# Patient Record
Sex: Female | Born: 1975 | Race: White | Hispanic: No | Marital: Married | Smoking: Former smoker
Health system: Southern US, Community
[De-identification: ages and names within clinical notes are randomized; demographics above are authoritative.]

## PROBLEM LIST (undated history)

## (undated) ENCOUNTER — Inpatient Hospital Stay: Payer: Self-pay

## (undated) DIAGNOSIS — S0300XA Dislocation of jaw, unspecified side, initial encounter: Secondary | ICD-10-CM

## (undated) DIAGNOSIS — I38 Endocarditis, valve unspecified: Secondary | ICD-10-CM

## (undated) HISTORY — DX: Endocarditis, valve unspecified: I38

## (undated) HISTORY — PX: TONSILLECTOMY: SUR1361

## (undated) HISTORY — DX: Dislocation of jaw, unspecified side, initial encounter: S03.00XA

---

## 2017-06-28 NOTE — L&D Delivery Note (Signed)
Delivery Note At 1:24 AM a viable female was delivered via Vaginal, Spontaneous (Presentation: ROA).  APGAR:8, 9; weight 8 lb 4.3 oz (3750 g).   Placenta status:spontanoeus, intact.  Cord:  3VC without complications.  Anesthesia:  Epidural Episiotomy: None Lacerations:  none Suture Repair: none Est. Blood Loss (mL):  300mL  Mom to postpartum.  Baby to Couplet care / Skin to Skin.  Karina Franco 11/30/2017, 1:41 AM

## 2017-06-30 ENCOUNTER — Ambulatory Visit (INDEPENDENT_AMBULATORY_CARE_PROVIDER_SITE_OTHER): Payer: Medicaid Other | Admitting: Advanced Practice Midwife

## 2017-06-30 ENCOUNTER — Encounter: Payer: Self-pay | Admitting: Advanced Practice Midwife

## 2017-06-30 VITALS — BP 120/66 | HR 95 | Ht 66.0 in | Wt 178.0 lb

## 2017-06-30 DIAGNOSIS — O0990 Supervision of high risk pregnancy, unspecified, unspecified trimester: Secondary | ICD-10-CM | POA: Insufficient documentation

## 2017-06-30 DIAGNOSIS — O09529 Supervision of elderly multigravida, unspecified trimester: Secondary | ICD-10-CM | POA: Insufficient documentation

## 2017-06-30 DIAGNOSIS — O99330 Smoking (tobacco) complicating pregnancy, unspecified trimester: Secondary | ICD-10-CM

## 2017-06-30 DIAGNOSIS — Z124 Encounter for screening for malignant neoplasm of cervix: Secondary | ICD-10-CM

## 2017-06-30 DIAGNOSIS — Z113 Encounter for screening for infections with a predominantly sexual mode of transmission: Secondary | ICD-10-CM

## 2017-06-30 NOTE — Patient Instructions (Signed)

## 2017-06-30 NOTE — Progress Notes (Signed)
New Obstetric Patient H&P    Chief Complaint: "Desires prenatal care"   History of Present Illness: Patient is a 42 y.o. [name] Not Hispanic or Latino female, LMP 02/28/2017 presents with amenorrhea and positive home pregnancy test. Based on her  LMP, her EDD is Estimated Date of Delivery: 12/05/2017. and her EGA is [redacted]w[redacted]d. Cycles are 5. days, regular, and occur approximately every : 28 days. Her last pap smear was 2 or 3 years ago and was no abnormalities.    She had a urine pregnancy test which was positive 2 week(s)  ago. Her last menstrual period was normal and lasted for  6 day(s). Since her LMP she claims she has experienced breast tenderness, fatigue, nausea. She denies vaginal bleeding. Her past medical history is noncontributory. Her prior pregnancies are notable for none  Since her LMP, she admits to the use of tobacco products  Yes she smokes 1 PPD She claims she has gained   13 pounds since the start of her pregnancy.  There are cats in the home in the home  no  She admits close contact with children on a regular basis  yes  She has had chicken pox in the past yes She has had Tuberculosis exposures, symptoms, or previously tested positive for TB   no Current or past history of domestic violence. no  Genetic Screening/Teratology Counseling: (Includes patient, baby's father, or anyone in either family with:)   1. Patient's age >/= 12 at [facility]  yes 2. Thalassemia ([place], [place], Mediterranean, or Asian background): MCV<80  no 3. Neural tube defect (meningomyelocele, spina bifida, anencephaly)  no 4. Congenital heart defect  no  5. Down syndrome  no 6. Tay-Sachs (Jewish, [place]  no 7. Canavan's Disease  no 8. Sickle cell disease or trait (African)  no  9. Hemophilia or other blood disorders  no  10. Muscular dystrophy  no  11. Cystic fibrosis  no  12. Huntington's Chorea  no  13. Mental retardation/autism  no 14. Other inherited genetic or chromosomal disorder   no 15. Maternal metabolic disorder (DM, PKU, etc)  no 16. Patient or FOB with a child with a birth defect not listed above no  16a. Patient or FOB with a birth defect themselves no 17. Recurrent pregnancy loss, or stillbirth  no  18. Any medications since LMP other than prenatal vitamins (include vitamins, supplements, OTC meds, drugs, alcohol)  no 19. Any other genetic/environmental exposure to discuss  no  Infection History:   1. Lives with someone with TB or TB exposed  no  2. Patient or partner has history of genital herpes  no 3. Rash or viral illness since LMP  no 4. History of STI (GC, CT, HPV, syphilis, HIV)  no 5. History of recent travel :  no  Other pertinent information:  Patient needs to have dental work done for several abscessed teeth. Dental note given to patient today.    Review of Systems:10 point review of systems negative unless otherwise noted in HPI  Past Medical History:  Past Medical History:  Diagnosis Date  . 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 TMJ (dislocation of temporomandibular joint)     Past Surgical History:  Past Surgical History:  Procedure Laterality Date  . TONSILLECTOMY      Gynecologic History: Patient's last menstrual period was 02/28/2017 (exact date).  Obstetric History: [id number]  Family History:  History reviewed. No pertinent family history.  Social History:  Social History  Socioeconomic History  . Marital status: Married    Spouse name: Not on file  . Number of children: Not on file  . Years of education: Not on file  . Highest education level: Not on file  Social Needs  . Financial resource strain: Not on file  . Food insecurity - worry: Not on file  . Food insecurity - inability: Not on file  . Transportation needs - medical: Not on file  . Transportation needs - non-medical: Not on file  Occupational History  . Not on file  Tobacco Use  . Smoking status: Current Every Day Smoker  . Smokeless tobacco:  Never Used  Substance and Sexual Activity  . Alcohol use: No    Frequency: Never  . Drug use: No  . Sexual activity: Yes    Birth control/protection: None  Other Topics Concern  . Not on file  Social History Narrative  . Not on file    Allergies:  Not on File  Medications: Prior to Admission medications   Not on File    Physical Exam Vitals: Blood pressure 120/66, pulse 95, height 5\' 6"  (1.676 m), weight 178 lb (80.7 kg), last menstrual period 02/28/2017.  General: NAD HEENT: normocephalic, anicteric, missing teeth and tooth decay noted Thyroid: no enlargement, no palpable nodules Pulmonary: No increased work of breathing, CTAB Cardiovascular: RRR, distal pulses 2+ Abdomen: NABS, soft, non-tender, non-distended.  Umbilicus without lesions.  No hepatomegaly, splenomegaly or masses palpable. No evidence of hernia  Genitourinary:  External: Normal external female genitalia.  Normal urethral meatus, normal  Bartholin's and Skene's glands.    Vagina: Normal vaginal mucosa, no evidence of prolapse.    Cervix: Grossly normal in appearance, no bleeding, no CMT  Uterus: Enlarged, mobile, normal contour.    Adnexa: ovaries non-enlarged, no adnexal masses  Rectal: deferred Extremities: no edema, erythema, or tenderness Neurologic: Grossly intact Psychiatric: mood appropriate, affect full   Assessment: 42 y.o. G5P4004 at 4375w3d by LMP presenting to initiate prenatal care  Plan: 1) Avoid alcoholic beverages. 2) Patient encouraged not to smoke.  3) Discontinue the use of all non-medicinal drugs and chemicals.  4) Take prenatal vitamins daily.  5) Nutrition, food safety (fish, cheese advisories, and high nitrite foods) and exercise discussed. 6) Hospital and practice style discussed with cross coverage system.  7) Genetic Screening, such as with 1st Trimester Screening, cell free fetal DNA, AFP testing, and Ultrasound, as well as with amniocentesis and CVS as appropriate, is  discussed with patient. At the conclusion of today's visit patient declines genetic testing. She is aware she is beyond the testing time for 1st trimester screen and declines 2nd trimester screen. 8) Patient is asked about travel to areas at risk for the Zika virus, and counseled to avoid travel and exposure to mosquitoes or sexual partners who may have themselves been exposed to the virus. Testing is discussed, and will be ordered as appropriate.   Tresea MallJane Ashlee Bewley, CNM

## 2017-06-30 NOTE — Progress Notes (Signed)
[facility] Dizz[facility]

## 2017-07-01 LAB — RPR+RH+ABO+RUB AB+AB SCR+CB...
Antibody Screen: NEGATIVE
HEP B S AG: NEGATIVE
HIV SCREEN 4TH GENERATION: NONREACTIVE
Hematocrit: 33.7 % — ABNORMAL LOW (ref 34.0–46.6)
Hemoglobin: 11.5 g/dL (ref 11.1–15.9)
MCH: 30.3 pg (ref 26.6–33.0)
MCHC: 34.1 g/dL (ref 31.5–35.7)
MCV: 89 fL (ref 79–97)
PLATELETS: 208 10*3/uL (ref 150–379)
RBC: 3.79 x10E6/uL (ref 3.77–5.28)
RDW: 15 % (ref 12.3–15.4)
RPR: NONREACTIVE
RUBELLA: 2.24 {index} (ref >0.99)
Rh Factor: POSITIVE
VARICELLA: 2097 {index} (ref >165)
WBC: 9.4 10*3/uL (ref 3.4–10.8)

## 2017-07-02 LAB — URINE CULTURE

## 2017-07-02 LAB — IGP,CTNGTV,RFX APTIMA HPV ASCU
Chlamydia, Nuc. Acid Amp: NEGATIVE
GONOCOCCUS, NUC. ACID AMP: NEGATIVE
PAP Smear Comment: 0
Trich vag by NAA: NEGATIVE

## 2017-07-11 ENCOUNTER — Ambulatory Visit (INDEPENDENT_AMBULATORY_CARE_PROVIDER_SITE_OTHER): Payer: Medicaid Other

## 2017-07-11 ENCOUNTER — Ambulatory Visit (INDEPENDENT_AMBULATORY_CARE_PROVIDER_SITE_OTHER): Payer: Medicaid Other | Admitting: Maternal Newborn

## 2017-07-11 VITALS — BP 122/78 | Wt 183.0 lb

## 2017-07-11 DIAGNOSIS — Z3A19 19 weeks gestation of pregnancy: Secondary | ICD-10-CM

## 2017-07-11 DIAGNOSIS — O0990 Supervision of high risk pregnancy, unspecified, unspecified trimester: Secondary | ICD-10-CM

## 2017-07-11 DIAGNOSIS — Z362 Encounter for other antenatal screening follow-up: Secondary | ICD-10-CM | POA: Diagnosis not present

## 2017-07-11 DIAGNOSIS — Z3689 Encounter for other specified antenatal screening: Secondary | ICD-10-CM

## 2017-07-11 NOTE — Progress Notes (Signed)
    Routine Prenatal Care Visit  Subjective  Dewain PenningRachelle Wittwer is a 42 y.o. G5P4004 at 4669w0d being seen today for ongoing prenatal care.  She is currently monitored for the following issues for this high-risk pregnancy and has Tobacco smoking affecting pregnancy, antepartum; Antepartum multigravida of advanced maternal age; and Supervision of high risk pregnancy, antepartum on their problem list.  ----------------------------------------------------------------------------------- Patient reports no complaints.   Vag. Bleeding: None.  Movement: Present. Denies leaking of fluid.  ----------------------------------------------------------------------------------- The following portions of the patient's history were reviewed and updated as appropriate: allergies, current medications, past family history, past medical history, past social history, past surgical history and problem list. Problem list updated.   Objective  Blood pressure 122/78, weight 183 lb (83 kg), last menstrual period 02/28/2017. Pregravid weight 165 lb (74.8 kg) Total Weight Gain 18 lb (8.165 kg) Urinalysis: Urine Protein: Negative Urine Glucose: Negative  Fetal Status: Fetal Heart Rate (bpm): 153   Movement: Present     General:  Alert, oriented and cooperative. Patient is in no acute distress.  Skin: Skin is warm and dry. No rash noted.   Cardiovascular: Normal heart rate noted  Respiratory: Normal respiratory effort, no problems with respiration noted  Abdomen: Soft, gravid, appropriate for gestational age. Pain/Pressure: Absent     Pelvic:  Cervical exam deferred        Extremities: Normal range of motion.     Mental Status: Normal mood and affect. Normal behavior. Normal judgment and thought content.     Assessment   42 y.o. N8G9562G5P4004 at 6069w0d, EDD 12/05/2017, by Last Menstrual Period presenting for routine prenatal visit.  Plan   pregnancy#5 Problems (from 02/28/17 to present)    Problem Noted Resolved   Supervision of high risk pregnancy, antepartum 06/30/2017 by Tresea MallGledhill, Jane, CNM No   Overview Addendum 07/11/2017  4:25 PM by Oswaldo ConroySchmid, Melquisedec Journey Y, CNM    [facility]inic [facility] Prenatal Labs  Dating  Blood type: A/Positive/-- (01/03 1140)   Genetic Screen 1 Screen:    AFP:     Quad:     NIPS: Antibody:Negative (01/03 1140)  Anatomic US  Rubella: 2.24 (01/03 1140) Varicella: @VZVIGG @  GTT Early:               Third trimester:  RPR: Non Reactive (01/03 1140)   Rhogam  HBsAg: Negative (01/03 1140)   TDaP vaccine    Flu Shot: declines HIV: Non Reactive (01/03 1140)   Baby Food                                GBS:   Contraception Plans BTL Pap:  CBB     CS/VBAC NA   Support Person John           Discussed need to follow up with an ultrasound next visit due to incomplete anatomy scan.   Preterm labor symptoms and general obstetric precautions including but not limited to vaginal bleeding, contractions, leaking of fluid and fetal movement were reviewed in detail with the patient.  Return in about 4 weeks (around 08/08/2017) for ROB and f/u anatomy ultrasound.  Marcelyn BruinsJacelyn Tywanda Rice, CNM 07/11/2017  4:39 PM

## 2017-07-11 NOTE — Progress Notes (Signed)
U/s today. No vb no lof.  

## 2017-07-14 DIAGNOSIS — Z0120 Encounter for dental examination and cleaning without abnormal findings: Secondary | ICD-10-CM | POA: Insufficient documentation

## 2017-08-08 ENCOUNTER — Encounter: Payer: Medicaid Other | Admitting: Maternal Newborn

## 2017-08-08 ENCOUNTER — Other Ambulatory Visit: Payer: Medicaid Other

## 2017-08-15 ENCOUNTER — Ambulatory Visit (INDEPENDENT_AMBULATORY_CARE_PROVIDER_SITE_OTHER): Payer: Medicaid Other

## 2017-08-15 ENCOUNTER — Encounter: Payer: Self-pay | Admitting: Advanced Practice Midwife

## 2017-08-15 ENCOUNTER — Ambulatory Visit (INDEPENDENT_AMBULATORY_CARE_PROVIDER_SITE_OTHER): Payer: Medicaid Other | Admitting: Advanced Practice Midwife

## 2017-08-15 VITALS — BP 100/62 | Wt 194.0 lb

## 2017-08-15 DIAGNOSIS — Z3689 Encounter for other specified antenatal screening: Secondary | ICD-10-CM

## 2017-08-15 DIAGNOSIS — Z131 Encounter for screening for diabetes mellitus: Secondary | ICD-10-CM

## 2017-08-15 DIAGNOSIS — O0990 Supervision of high risk pregnancy, unspecified, unspecified trimester: Secondary | ICD-10-CM

## 2017-08-15 DIAGNOSIS — Z130 Encounter for screening for diseases of the blood and blood-forming organs and certain disorders involving the immune mechanism: Secondary | ICD-10-CM

## 2017-08-15 DIAGNOSIS — Z3A24 24 weeks gestation of pregnancy: Secondary | ICD-10-CM

## 2017-08-15 DIAGNOSIS — Z113 Encounter for screening for infections with a predominantly sexual mode of transmission: Secondary | ICD-10-CM

## 2017-08-15 NOTE — Progress Notes (Addendum)
  Routine Prenatal Care Visit  Subjective  Karina Franco is a 42 y.o. G5P4004 at 5883w0d being seen today for ongoing prenatal care.  She is currently monitored for the following issues for this high-risk pregnancy and has Tobacco smoking affecting pregnancy, antepartum; Antepartum multigravida of advanced maternal age; and Supervision of high risk pregnancy, antepartum on their problem list.  ----------------------------------------------------------------------------------- Patient reports hip pain.   Contractions: Not present. Vag. Bleeding: None.  Movement: Present. Denies leaking of fluid.  ----------------------------------------------------------------------------------- The following portions of the patient's history were reviewed and updated as appropriate: allergies, current medications, past family history, past medical history, past social history, past surgical history and problem list. Problem list updated.   Objective  Blood pressure 100/62, weight 194 lb (88 kg), last menstrual period 02/28/2017. Pregravid weight 165 lb (74.8 kg) Total Weight Gain 29 lb (13.2 kg) Urinalysis:      Fetal Status: Fetal Heart Rate (bpm): 153   Movement: Present     Anatomy f/u today: anatomy is now complete  General:  Alert, oriented and cooperative. Patient is in no acute distress.  Skin: Skin is warm and dry. No rash noted.   Cardiovascular: Normal heart rate noted  Respiratory: Normal respiratory effort, no problems with respiration noted  Abdomen: Soft, gravid, appropriate for gestational age. Pain/Pressure: Absent     Pelvic:  Cervical exam deferred        Extremities: Normal range of motion.  Edema: None  Mental Status: Normal mood and affect. Normal behavior. Normal judgment and thought content.   Assessment   42 y.o. N8G9562G5P4004 at 10183w0d by  12/05/2017, by Last Menstrual Period presenting for routine prenatal visit  Plan   pregnancy#5 Problems (from 02/28/17 to present)    Problem  Noted Resolved   Supervision of high risk pregnancy, antepartum 06/30/2017 by Tresea MallGledhill, Kedrick Mcnamee, CNM No   Overview Addendum 07/11/2017  4:39 PM by Oswaldo ConroySchmid, Jacelyn Y, CNM    [facility] Prenatal Labs  Dating L=19 Blood type: A/Positive/-- (01/03 1140)   Genetic Screen 1 Screen:    AFP:     Quad:     NIPS: Antibody:Negative (01/03 1140)  Anatomic US Follow up for cardiac, diaphragm, nose/lips Rubella: 2.24 (01/03 1140) Varicella: Immune  GTT Early:               Third trimester:  RPR: Non Reactive (01/03 1140)   Rhogam  HBsAg: Negative (01/03 1140)   TDaP vaccine    Flu Shot: declines HIV: Non Reactive (01/03 1140)   Baby Food                                GBS:   Contraception Plans BTL Pap:  CBB     CS/VBAC NA   Support Person John           AMA:  Growth scan 32 wks [ ] , 36 wks [ ]  NST/AFI weekly at 36 wks   Preterm labor symptoms and general obstetric precautions including but not limited to vaginal bleeding, contractions, leaking of fluid and fetal movement were reviewed in detail with the patient. Please refer to After Visit Summary for other counseling recommendations.   Return in about 4 weeks (around 09/12/2017) for 3 hr gtt/other 28 wk labs/rob.  Tresea MallJane Delphine Sizemore, CNM  08/15/2017 2:55 PM

## 2017-08-15 NOTE — Progress Notes (Addendum)
ROB U/S today 

## 2017-09-12 ENCOUNTER — Other Ambulatory Visit: Payer: Medicaid Other

## 2017-09-12 ENCOUNTER — Ambulatory Visit (INDEPENDENT_AMBULATORY_CARE_PROVIDER_SITE_OTHER): Payer: Medicaid Other | Admitting: Obstetrics and Gynecology

## 2017-09-12 VITALS — BP 120/68 | Wt 190.0 lb

## 2017-09-12 DIAGNOSIS — K219 Gastro-esophageal reflux disease without esophagitis: Secondary | ICD-10-CM

## 2017-09-12 DIAGNOSIS — O09529 Supervision of elderly multigravida, unspecified trimester: Secondary | ICD-10-CM

## 2017-09-12 DIAGNOSIS — Z3A28 28 weeks gestation of pregnancy: Secondary | ICD-10-CM

## 2017-09-12 DIAGNOSIS — O0990 Supervision of high risk pregnancy, unspecified, unspecified trimester: Secondary | ICD-10-CM

## 2017-09-12 DIAGNOSIS — O99330 Smoking (tobacco) complicating pregnancy, unspecified trimester: Secondary | ICD-10-CM

## 2017-09-12 MED ORDER — SUCRALFATE 1 GM/10ML PO SUSP: 1.0000 g | Freq: Four times a day (QID) | ORAL | 1 refills | Status: DC

## 2017-09-12 NOTE — Progress Notes (Signed)
    Routine Prenatal Care Visit  Subjective  Karina Franco is a 42 y.o. G5P4004 at 4421w0d being seen today for ongoing prenatal care.  She is currently monitored for the following issues for this high-risk pregnancy and has Tobacco smoking affecting pregnancy, antepartum; Antepartum multigravida of advanced maternal age; Supervision of high risk pregnancy, antepartum; and Gastroesophageal reflux disease without esophagitis on their problem list.  ----------------------------------------------------------------------------------- Patient reports heartburn.   Contractions: Not present. Vag. Bleeding: None.  Movement: Present. Denies leaking of fluid.     Objective   Vitals:   09/12/17 1007  BP: 120/68  Weight: 190 lb (86.2 kg)   Pregravid Weight: 165 lb (74.8 kg)  Total Weight Gain: 25 lb (11.3 kg)  Urinalysis: Urine Protein: Negative Urine Glucose: 1+  Fetal Status: Fetal Heart Rate (bpm): 145   Movement: Present     General: Alert: Oriented and cooperative. Patient is in no acute distress. Skin: Skin is warm and dry. No rash noted.  Cardiovascular: Regular rate and rhythm. No murmurs, gallops, or rubs. Respiratory: Normal respiratory effort, no problems with respiration noted Abdomen: Soft, gravid, appropriate for gestational age. Pain/Pressure: Present Pelvic: Cervical exam deferred       Extremeties: Normal range of motion.    Mental Status: Normal mood and affect. Normal behavior. Normal judgment and thought content.  Assessment   42 y.o. O9G2952G5P4004 at 4221w0d by  12/05/2017, by Last Menstrual Period presenting for routine prenatal visit  Plan   pregnancy#5 Problems (from 02/28/17 to present)    Problem Noted Resolved   Supervision of high risk pregnancy, antepartum 06/30/2017 by Tresea MallGledhill, Jane, CNM No   Overview Addendum 09/12/2017 10:48 AM by Natale MilchSchuman, Calyse Murcia R, MD    [facility] Prenatal Labs  Dating L=19 Blood type: A/Positive/-- (01/03 1140)   Genetic Screen  Declined Antibody:Negative (01/03 1140)  Anatomic US Follow up for cardiac, diaphragm, nose/lips Rubella: 2.24 (01/03 1140) Varicella: Immune  GTT  Third trimester:  RPR: Non Reactive (01/03 1140)   Rhogam  not needed HBsAg: Negative (01/03 1140)   TDaP vaccine     Flu Shot: declines HIV: Non Reactive (01/03 1140)   Baby Food  Bottle                              GBS:  Hx of infant with GBS pneumonia, treat patient in labor  Contraception Plans BTL  Pap: NIL 2019  CBB   Given information   CS/VBAC NA   Support Person John              Preterm labor symptoms and general obstetric precautions including but not limited to vaginal bleeding, contractions, leaking of fluid and fetal movement were reviewed in detail with the patient.  Please refer to After Visit Summary for other counseling recommendations.   3hr GTT per preference of patient Carafate for GERD Calamine lotion for dry skin/ itching Given information for cord blood banking   Sign tubal consent at next visit.  Return in about 2 weeks (around 09/26/2017) for ROB.  Adelene Idlerhristanna Prerna Harold M.D. 09/12/17 10:52 AM

## 2017-09-12 NOTE — Progress Notes (Signed)
ROB Dizzy spells Rash on back itches 3 hour GTT w/other 28 week labs

## 2017-09-13 LAB — CBC WITH DIFFERENTIAL/PLATELET
Basophils Absolute: 0 10*3/uL (ref 0.0–0.2)
Basos: 0 %
EOS (ABSOLUTE): 0.1 10*3/uL (ref 0.0–0.4)
EOS: 1 %
HEMATOCRIT: 34.4 % (ref 34.0–46.6)
Hemoglobin: 11.2 g/dL (ref 11.1–15.9)
Immature Grans (Abs): 0.1 10*3/uL (ref 0.0–0.1)
Immature Granulocytes: 1 %
LYMPHS ABS: 1.2 10*3/uL (ref 0.7–3.1)
Lymphs: 13 %
MCH: 31.5 pg (ref 26.6–33.0)
MCHC: 32.6 g/dL (ref 31.5–35.7)
MCV: 97 fL (ref 79–97)
MONOS ABS: 0.3 10*3/uL (ref 0.1–0.9)
Monocytes: 4 %
NEUTROS ABS: 7.1 10*3/uL — AB (ref 1.4–7.0)
NEUTROS PCT: 81 %
PLATELETS: 181 10*3/uL (ref 150–379)
RBC: 3.55 x10E6/uL — ABNORMAL LOW (ref 3.77–5.28)
RDW: 13.7 % (ref 12.3–15.4)
WBC: 8.7 10*3/uL (ref 3.4–10.8)

## 2017-09-13 LAB — GESTATIONAL GLUCOSE TOLERANCE
GLUCOSE 1 HOUR GTT: 179 mg/dL (ref 65–179)
GLUCOSE 2 HOUR GTT: 157 mg/dL — AB (ref 65–154)
GLUCOSE 3 HOUR GTT: 33 mg/dL — AB (ref 65–139)
Glucose, Fasting: 88 mg/dL (ref 65–94)

## 2017-09-13 LAB — RPR QUALITATIVE: RPR Ser Ql: NONREACTIVE

## 2017-09-13 LAB — HIV ANTIBODY (ROUTINE TESTING W REFLEX): HIV SCREEN 4TH GENERATION: NONREACTIVE

## 2017-09-29 ENCOUNTER — Encounter: Payer: Self-pay | Admitting: Obstetrics and Gynecology

## 2017-09-29 ENCOUNTER — Ambulatory Visit (INDEPENDENT_AMBULATORY_CARE_PROVIDER_SITE_OTHER): Payer: Medicaid Other | Admitting: Obstetrics and Gynecology

## 2017-09-29 VITALS — BP 114/78 | Wt 199.0 lb

## 2017-09-29 DIAGNOSIS — K219 Gastro-esophageal reflux disease without esophagitis: Secondary | ICD-10-CM

## 2017-09-29 DIAGNOSIS — O09529 Supervision of elderly multigravida, unspecified trimester: Secondary | ICD-10-CM

## 2017-09-29 DIAGNOSIS — O0990 Supervision of high risk pregnancy, unspecified, unspecified trimester: Secondary | ICD-10-CM

## 2017-09-29 DIAGNOSIS — Z23 Encounter for immunization: Secondary | ICD-10-CM

## 2017-09-29 DIAGNOSIS — Z3A30 30 weeks gestation of pregnancy: Secondary | ICD-10-CM

## 2017-09-29 DIAGNOSIS — B379 Candidiasis, unspecified: Secondary | ICD-10-CM

## 2017-09-29 DIAGNOSIS — O99330 Smoking (tobacco) complicating pregnancy, unspecified trimester: Secondary | ICD-10-CM

## 2017-09-29 MED ORDER — PANTOPRAZOLE SODIUM 40 MG PO TBEC: 40.0000 mg | DELAYED_RELEASE_TABLET | Freq: Every day | ORAL | 3 refills | Status: DC

## 2017-09-29 MED ORDER — NYSTATIN 100000 UNIT/GM EX CREA: 1.0000 "application " | TOPICAL_CREAM | Freq: Two times a day (BID) | CUTANEOUS | 0 refills | Status: AC

## 2017-09-29 MED ORDER — PRENATAL 27-0.8 MG PO TABS: 1.0000 | ORAL_TABLET | Freq: Every day | ORAL | Status: DC

## 2017-09-29 NOTE — Progress Notes (Signed)
  Routine Prenatal Care Visit  Subjective  Karina Franco is a 42 y.o. G5P4004 at 10071w3d being seen today for ongoing prenatal care.  She is currently monitored for the following issues for this high-risk pregnancy and has Tobacco smoking affecting pregnancy, antepartum; Antepartum multigravida of advanced maternal age; Supervision of high risk pregnancy, antepartum; Gastroesophageal reflux disease without esophagitis; and Candida infection on their problem list.  ----------------------------------------------------------------------------------- Patient reports rash under right breast- consistent with candida Contractions: Not present. Vag. Bleeding: None.  Movement: Present. Denies leaking of fluid.  ----------------------------------------------------------------------------------- The following portions of the patient's history were reviewed and updated as appropriate: allergies, current medications, past family history, past medical history, past social history, past surgical history and problem list. Problem list updated.   Objective  Blood pressure 114/78, weight 199 lb (90.3 kg), last menstrual period 02/28/2017. Pregravid weight 165 lb (74.8 kg) Total Weight Gain 34 lb (15.4 kg) Urinalysis:      Fetal Status: Fetal Heart Rate (bpm): 145 Fundal Height: 30 cm Movement: Present     General:  Alert, oriented and cooperative. Patient is in no acute distress.  Skin: Skin is warm and dry. No rash noted.   Cardiovascular: Normal heart rate noted  Respiratory: Normal respiratory effort, no problems with respiration noted  Abdomen: Soft, gravid, appropriate for gestational age. Pain/Pressure: Absent     Pelvic:  Cervical exam deferred        Extremities: Normal range of motion.     Mental Status: Normal mood and affect. Normal behavior. Normal judgment and thought content.   Assessment   42 y.o. [id number] at 3171w3d by  12/05/2017, by Last Menstrual Period presenting for routine prenatal  visit  Plan   pregnancy#5 Problems (from 02/28/17 to present)    Problem Noted Resolved   Supervision of high risk pregnancy, antepartum 06/30/2017 by Tresea MallGledhill, Jane, CNM No   Overview Addendum 09/12/2017 10:48 AM by Natale MilchSchuman, Christanna R, MD    [facility] Prenatal Labs  Dating L=19 Blood type: A/Positive/-- (01/03 1140)   Genetic Screen Declined Antibody:Negative (01/03 1140)  Anatomic US Follow up for cardiac, diaphragm, nose/lips Rubella: 2.24 (01/03 1140) Varicella: Immune  GTT  Third trimester:  RPR: Non Reactive (01/03 1140)   Rhogam  not needed HBsAg: Negative (01/03 1140)   TDaP vaccine     Flu Shot: declines HIV: Non Reactive (01/03 1140)   Baby Food  Bottle                              GBS:  Hx of infant with GBS pneumonia, treat patient in labor  Contraception Plans BTL Pap: NIL 2019  CBB   Given information   CS/VBAC NA   Support Person John              Preterm labor symptoms and general obstetric precautions including but not limited to vaginal bleeding, contractions, leaking of fluid and fetal movement were reviewed in detail with the patient. Please refer to After Visit Summary for other counseling recommendations.   - continued heartburn. Protonix Rx given - candida infection: topical nystatin - sign BTL papers today  Return in about 2 weeks (around 10/13/2017) for Routine Prenatal Appointment.  Thomasene MohairStephen Masaichi Kracht, MD, Merlinda FrederickFACOG [facility], [facility] 09/29/2017 3:12 PM

## 2017-09-29 NOTE — Progress Notes (Incomplete)
  Routine Prenatal Care Visit  Subjective  Karina Franco is a 41 y.o. G5P4004 at [redacted]w[redacted]d being seen today for ongoing prenatal care.  She is currently monitored for the following issues for this {Blank single:19197::"high-risk","low-risk"} pregnancy and has Tobacco smoking affecting pregnancy, antepartum; Antepartum multigravida of advanced maternal age; Supervision of high risk pregnancy, antepartum; Gastroesophageal reflux disease without esophagitis; and Candida infection on their problem list.  ----------------------------------------------------------------------------------- Patient reports {sx:14538}.   Contractions: Not present. Vag. Bleeding: None.  Movement: Present. Denies leaking of fluid.  ----------------------------------------------------------------------------------- The following portions of the patient's history were reviewed and updated as appropriate: allergies, current medications, past family history, past medical history, past social history, past surgical history and problem list. Problem list updated.   Objective  Blood pressure 114/78, weight 199 lb (90.3 kg), last menstrual period 02/28/2017. Pregravid weight 165 lb (74.8 kg) Total Weight Gain 34 lb (15.4 kg) Urinalysis:      Fetal Status: Fetal Heart Rate (bpm): 145 Fundal Height: 30 cm Movement: Present     General:  Alert, oriented and cooperative. Patient is in no acute distress.  Skin: Skin is warm and dry. No rash noted.   Cardiovascular: Normal heart rate noted  Respiratory: Normal respiratory effort, no problems with respiration noted  Abdomen: Soft, gravid, appropriate for gestational age. Pain/Pressure: Absent     Pelvic:  {Blank single:19197::"Cervical exam performed","Cervical exam deferred"}        Extremities: Normal range of motion.     Mental Status: Normal mood and affect. Normal behavior. Normal judgment and thought content.   Assessment   41 y.o. G5P4004 at [redacted]w[redacted]d by  12/05/2017, by Last  Menstrual Period presenting for {Blank single:19197::"routine","work-in"} prenatal visit  Plan   pregnancy#5 Problems (from 02/28/17 to present)    Problem Noted Resolved   Supervision of high risk pregnancy, antepartum 06/30/2017 by Gledhill, Jane, CNM No   Overview Addendum 09/12/2017 10:48 AM by Schuman, Christanna R, MD    [facility] Prenatal Labs  Dating L=19 Blood type: A/Positive/-- (01/03 1140)   Genetic Screen Declined Antibody:Negative (01/03 1140)  Anatomic US Follow up for cardiac, diaphragm, nose/lips Rubella: 2.24 (01/03 1140) Varicella: Immune  GTT  Third trimester:  RPR: Non Reactive (01/03 1140)   Rhogam  not needed HBsAg: Negative (01/03 1140)   TDaP vaccine     Flu Shot: declines HIV: Non Reactive (01/03 1140)   Baby Food  Bottle                              GBS:  Hx of infant with GBS pneumonia, treat patient in labor  Contraception Plans BTL Pap: NIL 2019  CBB   Given information   CS/VBAC NA   Support Person John              {Blank single:19197::"Term","Preterm"} labor symptoms and general obstetric precautions including but not limited to vaginal bleeding, contractions, leaking of fluid and fetal movement were reviewed in detail with the patient. Please refer to After Visit Summary for other counseling recommendations.   Return in about 2 weeks (around 10/13/2017) for Routine Prenatal Appointment.  Tayshun Gappa, MD, FACOG [facility], [facility] 09/29/2017 3:12 PM   

## 2017-09-29 NOTE — Progress Notes (Incomplete)
  Routine Prenatal Care Visit  Subjective  Karina Franco is a [age] y.o. G5P4004 at 7956w3d being seen today for ongoing prenatal care.  She is currently monitored for the following issues for this {Blank single:19197::"high-risk","low-risk"} pregnancy and has Tobacco smoking affecting pregnancy, antepartum; Antepartum multigravida of advanced maternal age; Supervision of high risk pregnancy, antepartum; Gastroesophageal reflux disease without esophagitis; and Candida infection on their problem list.  ----------------------------------------------------------------------------------- Patient reports {sx:14538}.   Contractions: Not present. Vag. Bleeding: None.  Movement: Present. Denies leaking of fluid.  ----------------------------------------------------------------------------------- The following portions of the patient's history were reviewed and updated as appropriate: allergies, current medications, past family history, past medical history, past social history, past surgical history and problem list. Problem list updated.   Objective  Blood pressure 114/78, weight 199 lb (90.3 kg), last menstrual period 02/28/2017. Pregravid weight 165 lb (74.8 kg) Total Weight Gain 34 lb (15.4 kg) Urinalysis:      Fetal Status: Fetal Heart Rate (bpm): 145 Fundal Height: 30 cm Movement: Present     General:  Alert, oriented and cooperative. Patient is in no acute distress.  Skin: Skin is warm and dry. No rash noted.   Cardiovascular: Normal heart rate noted  Respiratory: Normal respiratory effort, no problems with respiration noted  Abdomen: Soft, gravid, appropriate for gestational age. Pain/Pressure: Absent     Pelvic:  {Blank single:19197::"Cervical exam performed","Cervical exam deferred"}        Extremities: Normal range of motion.     Mental Status: Normal mood and affect. Normal behavior. Normal judgment and thought content.   Assessment   42 y.o. [id number] at 4856w3d by  12/05/2017, by Last  Menstrual Period presenting for {Blank single:19197::"routine","work-in"} prenatal visit  Plan   pregnancy#5 Problems (from 02/28/17 to present)    Problem Noted Resolved   Supervision of high risk pregnancy, antepartum 06/30/2017 by Tresea MallGledhill, Jane, CNM No   Overview Addendum 09/12/2017 10:48 AM by Natale MilchSchuman, Christanna R, MD    Clinic [facility] Prenatal Labs  Dating L=19 Blood type: A/Positive/-- (01/03 1140)   Genetic Screen Declined Antibody:Negative (01/03 1140)  Anatomic US Follow up for cardiac, diaphragm, nose/lips Rubella: 2.24 (01/03 1140) Varicella: Immune  GTT  Third trimester:  RPR: Non Reactive (01/03 1140)   Rhogam  not needed HBsAg: Negative (01/03 1140)   TDaP vaccine     Flu Shot: declines HIV: Non Reactive (01/03 1140)   Baby Food  Bottle                              GBS:  Hx of infant with GBS pneumonia, treat patient in labor  Contraception Plans BTL Pap: NIL 2019  CBB   Given information   CS/VBAC NA   Support Person John              {Blank single:19197::"Term","Preterm"} labor symptoms and general obstetric precautions including but not limited to vaginal bleeding, contractions, leaking of fluid and fetal movement were reviewed in detail with the patient. Please refer to After Visit Summary for other counseling recommendations.   Return in about 2 weeks (around 10/13/2017) for Routine Prenatal Appointment.  Thomasene MohairStephen Lennex Pietila, MD, Merlinda FrederickFACOG [facility], [facility] 09/29/2017 3:12 PM

## 2017-10-12 ENCOUNTER — Telehealth: Payer: Self-pay

## 2017-10-12 NOTE — Telephone Encounter (Signed)
[facility] states that Pt needs letter saying she is high risk and using transportation to get to our office for appt's since she is high risk. Seeing [facility] 4/18. Fax number   [phone] attention Dillon Bjorkonna Graves

## 2017-10-13 ENCOUNTER — Ambulatory Visit (INDEPENDENT_AMBULATORY_CARE_PROVIDER_SITE_OTHER): Payer: Medicaid Other | Admitting: Advanced Practice Midwife

## 2017-10-13 ENCOUNTER — Encounter: Payer: Self-pay | Admitting: Advanced Practice Midwife

## 2017-10-13 VITALS — BP 110/60 | Wt 202.0 lb

## 2017-10-13 DIAGNOSIS — Z3A32 32 weeks gestation of pregnancy: Secondary | ICD-10-CM

## 2017-10-13 DIAGNOSIS — O0990 Supervision of high risk pregnancy, unspecified, unspecified trimester: Secondary | ICD-10-CM

## 2017-10-13 NOTE — Progress Notes (Signed)
Routine Prenatal Care Visit  Subjective  Karina Franco is a 42 y.o. G5P4004 at [redacted]w[redacted]d being seen today for ongoing prenatal care.  She is currently monitored for the following issues for this high-risk pregnancy and has Tobacco smoking affecting pregnancy, antepartum; Antepartum multigravida of advanced maternal age; Supervision of high risk pregnancy, antepartum; Gastroesophageal reflux disease without esophagitis; and Candida infection on their problem list.  ----------------------------------------------------------------------------------- Patient reports discomforts of 3rd trimester. Patient asks about induction of labor due to her history of rapid and early labors. Her babies have all been born before 40 weeks and 3 out of 4 labors have been a total of 45 minutes each. Her circumstances were different with the other labor and it took 15 hours. She is concerned about living 45 minutes from the hospital and doesn't want to have the baby in the car. Discussed the possibility of IOL at 39 weeks. .   Contractions: Not present. Vag. Bleeding: None.  Movement: Present. Denies leaking of fluid.  ----------------------------------------------------------------------------------- The following portions of the patient's history were reviewed and updated as appropriate: allergies, current medications, past family history, past medical history, past social history, past surgical history and problem list. Problem list updated.   Objective  Blood pressure 110/60, weight 202 lb (91.6 kg), last menstrual period 02/28/2017. Pregravid weight 165 lb (74.8 kg) Total Weight Gain 37 lb (16.8 kg) Urinalysis:      Fetal Status: Fetal Heart Rate (bpm): 143 Fundal Height: 33 cm Movement: Present     General:  Alert, oriented and cooperative. Patient is in no acute distress.  Skin: Skin is warm and dry. No rash noted.   Cardiovascular: Normal heart rate noted  Respiratory: Normal respiratory effort, no problems  with respiration noted  Abdomen: Soft, gravid, appropriate for gestational age. Pain/Pressure: Absent     Pelvic:  Cervical exam deferred        Extremities: Normal range of motion.  Edema: None  Mental Status: Normal mood and affect. Normal behavior. Normal judgment and thought content.   Assessment   42 y.o. [name] at [redacted]w[redacted]d by  12/05/2017, by Last Menstrual Period presenting for routine prenatal visit  Plan   pregnancy#5 Problems (from 02/28/17 to present)    Problem Noted Resolved   Supervision of high risk pregnancy, antepartum 06/30/2017 by Tresea Mall, CNM No   Overview Addendum 09/12/2017 10:48 AM by Natale Milch, MD    [facility]inic [facility] Prenatal Labs  Dating L=19 Blood type: A/Positive/-- (01/03 1140)   Genetic Screen Declined Antibody:Negative (01/03 1140)  Anatomic [place] Follow up for cardiac, diaphragm, nose/lips Rubella: 2.24 (01/03 1140) Varicella: Immune  GTT  Third trimester:  RPR: Non Reactive (01/03 1140)   Rhogam  not needed HBsAg: Negative (01/03 1140)   TDaP vaccine     Flu Shot: declines HIV: Non Reactive (01/03 1140)   Baby Food  Bottle                              GBS:  Hx of infant with GBS pneumonia, treat patient in labor  Contraception Plans BTL Pap: NIL 2019  CBB   Given information   CS/VBAC NA   Support Person John              Preterm labor symptoms and general obstetric precautions including but not limited to vaginal bleeding, contractions, leaking of fluid and fetal movement were reviewed in detail with the patient.    Return in  about 2 weeks (around 10/27/2017) for growth scan and rob.  Tresea MallJane Charlene Cowdrey, CNM 10/13/2017 3:32 PM

## 2017-10-13 NOTE — Telephone Encounter (Signed)
Thank you I will write her a letter

## 2017-10-13 NOTE — Progress Notes (Signed)
ROB Discuss [place]

## 2017-10-28 ENCOUNTER — Ambulatory Visit (INDEPENDENT_AMBULATORY_CARE_PROVIDER_SITE_OTHER): Payer: Medicaid Other

## 2017-10-28 ENCOUNTER — Ambulatory Visit (INDEPENDENT_AMBULATORY_CARE_PROVIDER_SITE_OTHER): Payer: Medicaid Other | Admitting: Maternal Newborn

## 2017-10-28 ENCOUNTER — Encounter: Payer: Self-pay | Admitting: Maternal Newborn

## 2017-10-28 VITALS — BP 100/60 | Wt 207.0 lb

## 2017-10-28 DIAGNOSIS — Z3A34 34 weeks gestation of pregnancy: Secondary | ICD-10-CM

## 2017-10-28 DIAGNOSIS — O0990 Supervision of high risk pregnancy, unspecified, unspecified trimester: Secondary | ICD-10-CM

## 2017-10-28 DIAGNOSIS — R1030 Lower abdominal pain, unspecified: Secondary | ICD-10-CM | POA: Diagnosis not present

## 2017-10-28 DIAGNOSIS — Z369 Encounter for antenatal screening, unspecified: Secondary | ICD-10-CM

## 2017-10-28 LAB — POCT URINALYSIS DIPSTICK
BILIRUBIN UA: NEGATIVE
Blood, UA: NEGATIVE
GLUCOSE UA: NEGATIVE
KETONES UA: NEGATIVE
Nitrite, UA: NEGATIVE
Protein, UA: NEGATIVE
SPEC GRAV UA: 1.01 (ref 1.010–1.025)
Urobilinogen, UA: NEGATIVE E.U./dL — AB
pH, UA: 6 (ref 5.0–8.0)

## 2017-10-28 NOTE — Progress Notes (Signed)
    Routine Prenatal Care Visit  Subjective  Karina Franco is a 42 y.o. G5P4004 at [redacted]w[redacted]d being seen today for ongoing prenatal care.  She is currently monitored for the following issues for this high-risk pregnancy and has Tobacco smoking affecting pregnancy, antepartum; Antepartum multigravida of advanced maternal age; Supervision of high risk pregnancy, antepartum; Gastroesophageal reflux disease without esophagitis; and Candida infection on their problem list.  ----------------------------------------------------------------------------------- Patient reports intermittent lower abdominal pain and pressure. Contractions: Not present. Vag. Bleeding: None.  Movement: Present. Denies leaking of fluid.  ----------------------------------------------------------------------------------- The following portions of the patient's history were reviewed and updated as appropriate: allergies, current medications, past family history, past medical history, past social history, past surgical history and problem list. Problem list updated.   Objective   Vitals:   10/28/17 1419  BP: 100/60    Pregravid weight 165 lb (74.8 kg) Total Weight Gain 42 lb (19.1 kg) Urinalysis: Urine Protein: Negative Urine Glucose: Negative  Fetal Status: Fetal Heart Rate (bpm): 148 Fundal Height: 35 cm Movement: Present     General:  Alert, oriented and cooperative. Patient is in no acute distress.  Skin: Skin is warm and dry. No rash noted.   Cardiovascular: Normal heart rate noted  Respiratory: Normal respiratory effort, no problems with respiration noted  Abdomen: Soft, gravid, appropriate for gestational age. Pain/Pressure: Present     Pelvic:  Cervical exam performed Dilation: 1.5 Effacement (%): 30 Station: -3  Extremities: Normal range of motion.     Mental Status: Normal mood and affect. Normal behavior. Normal judgment and thought content.     Assessment   42 y.o. [name] at [redacted]w[redacted]d, EDD 12/05/2017 by Last  Menstrual Period presenting for routine prenatal visit.  Plan   pregnancy#5 Problems (from 02/28/17 to present)    Problem Noted Resolved   Supervision of high risk pregnancy, antepartum 06/30/2017 by Tresea Mall, CNM No   Overview Addendum 09/12/2017 10:48 AM by Natale Milch, MD    [facility]inic [facility] Prenatal Labs  Dating L=19 Blood type: A/Positive/-- (01/03 1140)   Genetic Screen Declined Antibody:Negative (01/03 1140)  Anatomic [place] Follow up for cardiac, diaphragm, nose/lips Rubella: 2.24 (01/03 1140) Varicella: Immune  GTT  Third trimester:  RPR: Non Reactive (01/03 1140)   Rhogam  not needed HBsAg: Negative (01/03 1140)   TDaP vaccine     Flu Shot: declines HIV: Non Reactive (01/03 1140)   Baby Food  Bottle                              GBS:  Hx of infant with GBS pneumonia, treat patient in labor  Contraception Plans BTL Pap: NIL 2019  CBB   Given information   CS/VBAC NA   Support Person Jonny Ruiz           Growth scan today at 57.6 percentile, AFI 16.26. Begin twice weekly APT next visit.   UA with trace leukocytes, will send urine culture due to possible UTI symptoms and notify if treatment is needed.  Desires elective induction at 39 weeks for a history of precipitous delivery. Will schedule.  Preterm labor symptoms and general obstetric precautions including but not limited to vaginal bleeding, contractions, leaking of fluid and fetal movement were reviewed.   Return in about 10 days (around 11/07/2017) for ROB with NST.  Marcelyn Bruins, CNM 10/31/2017  11:36 AM

## 2017-10-28 NOTE — Progress Notes (Signed)
C/o pain and pressure.rj

## 2017-10-30 LAB — URINE CULTURE

## 2017-10-31 ENCOUNTER — Other Ambulatory Visit: Payer: Self-pay | Admitting: Maternal Newborn

## 2017-10-31 DIAGNOSIS — O2343 Unspecified infection of urinary tract in pregnancy, third trimester: Secondary | ICD-10-CM

## 2017-10-31 MED ORDER — NITROFURANTOIN MONOHYD MACRO 100 MG PO CAPS: 100.0000 mg | ORAL_CAPSULE | Freq: Two times a day (BID) | ORAL | 0 refills | Status: AC

## 2017-10-31 NOTE — Progress Notes (Signed)
Rx sent for Macrobid to treat UTI. Message left for patient on answering machine with results and notifying her to start treatment.  Marcelyn Bruins, CNM 10/31/2017  1:00 PM

## 2017-11-07 ENCOUNTER — Encounter: Payer: Self-pay | Admitting: Obstetrics and Gynecology

## 2017-11-07 ENCOUNTER — Ambulatory Visit (INDEPENDENT_AMBULATORY_CARE_PROVIDER_SITE_OTHER): Payer: Medicaid Other | Admitting: Obstetrics and Gynecology

## 2017-11-07 VITALS — BP 120/62 | Wt 212.0 lb

## 2017-11-07 DIAGNOSIS — O99330 Smoking (tobacco) complicating pregnancy, unspecified trimester: Secondary | ICD-10-CM

## 2017-11-07 DIAGNOSIS — O09529 Supervision of elderly multigravida, unspecified trimester: Secondary | ICD-10-CM

## 2017-11-07 DIAGNOSIS — Z3A36 36 weeks gestation of pregnancy: Secondary | ICD-10-CM

## 2017-11-07 DIAGNOSIS — N3000 Acute cystitis without hematuria: Secondary | ICD-10-CM

## 2017-11-07 DIAGNOSIS — O0990 Supervision of high risk pregnancy, unspecified, unspecified trimester: Secondary | ICD-10-CM

## 2017-11-07 MED ORDER — NITROFURANTOIN MONOHYD MACRO 100 MG PO CAPS: 100.0000 mg | ORAL_CAPSULE | Freq: Two times a day (BID) | ORAL | 1 refills | Status: DC

## 2017-11-07 NOTE — Progress Notes (Signed)
ROB °NST °GBS/Aptima °

## 2017-11-07 NOTE — Progress Notes (Signed)
    Routine Prenatal Care Visit  Subjective  Karina Franco is a 42 y.o. G5P4004 at [redacted]w[redacted]d being seen today for ongoing prenatal care.  She is currently monitored for the following issues for this high-risk pregnancy and has Tobacco smoking affecting pregnancy, antepartum; Antepartum multigravida of advanced maternal age; Supervision of high risk pregnancy, antepartum; Gastroesophageal reflux disease without esophagitis; and Candida infection on their problem list.  ----------------------------------------------------------------------------------- Patient reports backache.   Contractions: Not present. Vag. Bleeding: None.  Movement: Present. Denies leaking of fluid.  ----------------------------------------------------------------------------------- The following portions of the patient's history were reviewed and updated as appropriate: allergies, current medications, past family history, past medical history, past social history, past surgical history and problem list. Problem list updated.   Objective  Blood pressure 120/62, weight 212 lb (96.2 kg), last menstrual period 02/28/2017. Pregravid weight 165 lb (74.8 kg) Total Weight Gain 47 lb (21.3 kg) Urinalysis: Urine Protein: Negative Urine Glucose: Negative  Fetal Status: Fetal Heart Rate (bpm): 150 Fundal Height: 37 cm Movement: Present     General:  Alert, oriented and cooperative. Patient is in no acute distress.  Skin: Skin is warm and dry. No rash noted.   Cardiovascular: Normal heart rate noted  Respiratory: Normal respiratory effort, no problems with respiration noted  Abdomen: Soft, gravid, appropriate for gestational age. Pain/Pressure: Present     Pelvic:  Cervical exam performed Dilation: 1.5 Effacement (%): 50 Station: -3  Extremities: Normal range of motion.  Edema: None  ental Status: Normal mood and affect. Normal behavior. Normal judgment and thought content.     Assessment   42 y.o. [id number] at [redacted]w[redacted]d by   12/05/2017, by Last Menstrual Period presenting for routine prenatal visit  Plan   pregnancy#5 Problems (from 02/28/17 to present)    Problem Noted Resolved   Supervision of high risk pregnancy, antepartum 06/30/2017 by Karina Franco, CNM No   Overview Addendum 09/12/2017 10:48 AM by Karina Milch, MD    [facility]inic [facility] Prenatal Labs  Dating L=19 Blood type: A/Positive/-- (01/03 1140)   Genetic Screen Declined Antibody:Negative (01/03 1140)  Anatomic [place] Follow up for cardiac, diaphragm, nose/lips Rubella: 2.24 (01/03 1140) Varicella: Immune  GTT  Third trimester:  RPR: Non Reactive (01/03 1140)   Rhogam  not needed HBsAg: Negative (01/03 1140)   TDaP vaccine     Flu Shot: declines HIV: Non Reactive (01/03 1140)   Baby Food  Bottle                              GBS:  Hx of infant with GBS pneumonia, treat patient in labor  Contraception Plans BTL Pap: NIL 2019  CBB   Given information   CS/VBAC NA   Support Person Karina Franco              Gestational age appropriate obstetric precautions including but not limited to vaginal bleeding, contractions, leaking of fluid and fetal movement were reviewed in detail with the patient.    [name] declines physical therapy GBS. Gonorrhea and chlamydia collected today IOL planned for 39 weeks Return in about 1 week (around 11/14/2017) for ROB, as scheduled for this week. Karina Idler MD [facility], [facility] 11/07/17 3:30 PM

## 2017-11-07 NOTE — Addendum Note (Signed)
Addended by: Adelene Idler on: 11/07/2017 04:34 PM   Modules accepted: Orders

## 2017-11-09 ENCOUNTER — Ambulatory Visit (INDEPENDENT_AMBULATORY_CARE_PROVIDER_SITE_OTHER): Payer: Medicaid Other | Admitting: Maternal Newborn

## 2017-11-09 ENCOUNTER — Ambulatory Visit (INDEPENDENT_AMBULATORY_CARE_PROVIDER_SITE_OTHER): Payer: Medicaid Other

## 2017-11-09 ENCOUNTER — Encounter: Payer: Self-pay | Admitting: Maternal Newborn

## 2017-11-09 VITALS — BP 100/60 | Wt 210.0 lb

## 2017-11-09 DIAGNOSIS — O99333 Smoking (tobacco) complicating pregnancy, third trimester: Secondary | ICD-10-CM

## 2017-11-09 DIAGNOSIS — F17210 Nicotine dependence, cigarettes, uncomplicated: Secondary | ICD-10-CM | POA: Diagnosis not present

## 2017-11-09 DIAGNOSIS — Z3A37 37 weeks gestation of pregnancy: Secondary | ICD-10-CM

## 2017-11-09 DIAGNOSIS — Z3A36 36 weeks gestation of pregnancy: Secondary | ICD-10-CM | POA: Diagnosis not present

## 2017-11-09 DIAGNOSIS — O09523 Supervision of elderly multigravida, third trimester: Secondary | ICD-10-CM

## 2017-11-09 DIAGNOSIS — Z369 Encounter for antenatal screening, unspecified: Secondary | ICD-10-CM

## 2017-11-09 DIAGNOSIS — O0993 Supervision of high risk pregnancy, unspecified, third trimester: Secondary | ICD-10-CM | POA: Diagnosis not present

## 2017-11-09 DIAGNOSIS — Z3689 Encounter for other specified antenatal screening: Secondary | ICD-10-CM

## 2017-11-09 DIAGNOSIS — O0990 Supervision of high risk pregnancy, unspecified, unspecified trimester: Secondary | ICD-10-CM

## 2017-11-09 LAB — FETAL NONSTRESS TEST

## 2017-11-09 NOTE — Progress Notes (Signed)
No concerns.rj 

## 2017-11-09 NOTE — Progress Notes (Signed)
    Routine Prenatal Care Visit  Subjective  Karina Franco is a 42 y.o. G5P4004 at [redacted]w[redacted]d being seen today for ongoing prenatal care. She is currently monitored for the following issues for this high-risk pregnancy and has Tobacco smoking affecting pregnancy, antepartum; Antepartum multigravida of advanced maternal age; Supervision of high risk pregnancy, antepartum; Gastroesophageal reflux disease without esophagitis; and Candida infection on their problem list.  ----------------------------------------------------------------------------------- Patient reports no complaints.   Contractions: Not present. Vag. Bleeding: None.  Movement: Present. Denies leaking of fluid.  ----------------------------------------------------------------------------------- The following portions of the patient's history were reviewed and updated as appropriate: allergies, current medications, past family history, past medical history, past social history, past surgical history and problem list. Problem list updated.   Objective  Blood pressure 100/60, weight 210 lb (95.3 kg), last menstrual period 02/28/2017. Pregravid weight 165 lb (74.8 kg) Total Weight Gain 45 lb (20.4 kg) Urinalysis: Urine Protein: Negative Urine Glucose: Negative  Fetal Status: Fetal Heart Rate (bpm): 139 Fundal Height: 37 cm Movement: Present     General:  Alert, oriented and cooperative. Patient is in no acute distress.  Skin: Skin is warm and dry. No rash noted.   Cardiovascular: Normal heart rate noted  Respiratory: Normal respiratory effort, no problems with respiration noted  Abdomen: Soft, gravid, appropriate for gestational age. Pain/Pressure: Absent     Pelvic:  Cervical exam deferred        Extremities: Normal range of motion.  Edema: Trace  Mental Status: Normal mood and affect. Normal behavior. Normal judgment and thought content.   NST Baseline: 135 Variability: moderate Accelerations: present Decelerations:  absent Tocometry: none The patient was monitored for 20 minutes, fetal heart rate tracing was deemed reactive, Category I tracing.  Assessment   Karina Franco at [redacted]w[redacted]d, EDD 12/05/2017 by Last Menstrual Period presenting for routine prenatal visit.  Plan   pregnancy#5 Problems (from 02/28/17 to present)    Problem Noted Resolved   Supervision of high risk pregnancy, antepartum Karina/08/2017 by Tresea Mall, CNM No   Overview Addendum 09/12/2017 10:48 AM by Natale Milch, MD    [facility] Prenatal Labs  Dating L=19 Blood type: A/Positive/-- (01/03 1140)   Genetic Screen Declined Antibody:Negative (01/03 1140)  Anatomic [place] Follow up for cardiac, diaphragm, nose/lips Rubella: 2.24 (01/03 1140) Varicella: Immune  GTT  Third trimester:  RPR: Non Reactive (01/03 1140)   Rhogam  not needed HBsAg: Negative (01/03 1140)   TDaP vaccine     Flu Shot: declines HIV: Non Reactive (01/03 1140)   Baby Food  Bottle                              GBS:  Hx of infant with GBS pneumonia, treat patient in labor  Contraception Plans BTL Pap: NIL 2019  CBB   Given information   CS/VBAC NA   Support Person John           Reactive NST. AFI normal at 12.79 cm. Continue twice weekly APT.  Preterm labor symptoms and general obstetric precautions including but not limited to vaginal bleeding, contractions, leaking of fluid and fetal movement were reviewed.  Return in about 5 days (around 11/14/2017) for ROB/NST.  Marcelyn Bruins, CNM 11/09/2017  4:27 PM

## 2017-11-10 LAB — GC/CHLAMYDIA PROBE AMP
Chlamydia trachomatis, NAA: NEGATIVE
NEISSERIA GONORRHOEAE BY PCR: NEGATIVE

## 2017-11-12 LAB — CULTURE, BETA STREP (GROUP B ONLY): Strep Gp B Culture: NEGATIVE

## 2017-11-14 ENCOUNTER — Ambulatory Visit (INDEPENDENT_AMBULATORY_CARE_PROVIDER_SITE_OTHER): Payer: Medicaid Other | Admitting: Advanced Practice Midwife

## 2017-11-14 ENCOUNTER — Encounter: Payer: Self-pay | Admitting: Advanced Practice Midwife

## 2017-11-14 VITALS — BP 118/78 | Wt 212.0 lb

## 2017-11-14 DIAGNOSIS — Z3A37 37 weeks gestation of pregnancy: Secondary | ICD-10-CM | POA: Diagnosis not present

## 2017-11-14 DIAGNOSIS — F17210 Nicotine dependence, cigarettes, uncomplicated: Secondary | ICD-10-CM

## 2017-11-14 DIAGNOSIS — O99333 Smoking (tobacco) complicating pregnancy, third trimester: Secondary | ICD-10-CM

## 2017-11-14 DIAGNOSIS — O09523 Supervision of elderly multigravida, third trimester: Secondary | ICD-10-CM

## 2017-11-14 DIAGNOSIS — O0990 Supervision of high risk pregnancy, unspecified, unspecified trimester: Secondary | ICD-10-CM

## 2017-11-14 LAB — FETAL NONSTRESS TEST

## 2017-11-14 NOTE — Progress Notes (Signed)
NST today. No vb no lof.

## 2017-11-14 NOTE — Progress Notes (Signed)
  Routine Prenatal Care Visit  Subjective  Karina Franco is a 42 y.o. G5P4004 at [redacted]w[redacted]d being seen today for ongoing prenatal care.  She is currently monitored for the following issues for this high-risk pregnancy and has Tobacco smoking affecting pregnancy, antepartum; Antepartum multigravida of advanced maternal age; Supervision of high risk pregnancy, antepartum; Gastroesophageal reflux disease without esophagitis; Candida infection; and Encounter for dental exam and cleaning w/o abnormal findings on their problem list.  ----------------------------------------------------------------------------------- Patient reports shortness of breath, difficulty getting comfortable lying down.   Contractions: Not present. Vag. Bleeding: None.  Movement: Present. Denies leaking of fluid.  ----------------------------------------------------------------------------------- The following portions of the patient's history were reviewed and updated as appropriate: allergies, current medications, past family history, past medical history, past social history, past surgical history and problem list. Problem list updated.   Objective  Blood pressure 118/78, weight 212 lb (96.2 kg), last menstrual period 02/28/2017. Pregravid weight 165 lb (74.8 kg) Total Weight Gain 47 lb (21.3 kg) Urinalysis: Urine Protein: Negative Urine Glucose: Negative  Fetal Status: Fetal Heart Rate (bpm): 140   Movement: Present      Reactive 30 minute NST: baseline 140, moderate variability, +accelerations, -decelerations  General:  Alert, oriented and cooperative. Patient is in no acute distress.  Skin: Skin is warm and dry. No rash noted.   Cardiovascular: Normal heart rate noted  Respiratory: Normal respiratory effort, no problems with respiration noted  Abdomen: Soft, gravid, appropriate for gestational age. Pain/Pressure: Absent     Pelvic:  Cervical exam deferred        Extremities: Normal range of motion.  Edema: None    Mental Status: Normal mood and affect. Normal behavior. Normal judgment and thought content.   Assessment   42 y.o. [name] at [redacted]w[redacted]d by  12/05/2017, by Last Menstrual Period presenting for routine prenatal visit  Plan   pregnancy#5 Problems (from 02/28/17 to present)    Problem Noted Resolved   Supervision of high risk pregnancy, antepartum 06/30/2017 by Tresea Mall, CNM No   Overview Addendum 09/12/2017 10:48 AM by Natale Milch, MD    [facility]inic [facility] Prenatal Labs  Dating L=19 Blood type: A/Positive/-- (01/03 1140)   Genetic Screen Declined Antibody:Negative (01/03 1140)  Anatomic [place] Follow up for cardiac, diaphragm, nose/lips Rubella: 2.24 (01/03 1140) Varicella: Immune  GTT  Third trimester:  RPR: Non Reactive (01/03 1140)   Rhogam  not needed HBsAg: Negative (01/03 1140)   TDaP vaccine     Flu Shot: declines HIV: Non Reactive (01/03 1140)   Baby Food  Bottle                              GBS:  Hx of infant with GBS pneumonia, treat patient in labor  Contraception Plans BTL Pap: NIL 2019  CBB   Given information   CS/VBAC NA   Support Person John              Term labor symptoms and general obstetric precautions including but not limited to vaginal bleeding, contractions, leaking of fluid and fetal movement were reviewed in detail with the patient. Please refer to After Visit Summary for other counseling recommendations.   Return has f/u already scheduled.  Tresea Mall, CNM 11/14/2017 12:11 PM

## 2017-11-14 NOTE — Patient Instructions (Signed)
Vaginal Delivery Vaginal delivery means that you will give birth by pushing your baby out of your birth canal (vagina). A team of health care providers will help you before, during, and after vaginal delivery. Birth experiences are unique for every woman and every pregnancy, and birth experiences vary depending on where you choose to give birth. What should I do to prepare for my baby's birth? Before your baby is born, it is important to talk with your health care provider about:  Your labor and delivery preferences. These may include: ? Medicines that you may be given. ? How you will manage your pain. This might include non-medical pain relief techniques or injectable pain relief such as epidural analgesia. ? How you and your baby will be monitored during labor and delivery. ? Who may be in the labor and delivery room with you. ? Your feelings about surgical delivery of your baby (cesarean delivery, or C-section) if this becomes necessary. ? Your feelings about receiving donated blood through an IV tube (blood transfusion) if this becomes necessary.  Whether you are able: ? To take pictures or videos of the birth. ? To eat during labor and delivery. ? To move around, walk, or change positions during labor and delivery.  What to expect after your baby is born, such as: ? Whether delayed umbilical cord clamping and cutting is offered. ? Who will care for your baby right after birth. ? Medicines or tests that may be recommended for your baby. ? Whether breastfeeding is supported in your hospital or birth center. ? How long you will be in the hospital or birth center.  How any medical conditions you have may affect your baby or your labor and delivery experience.  To prepare for your baby's birth, you should also:  Attend all of your health care visits before delivery (prenatal visits) as recommended by your health care provider. This is important.  Prepare your home for your baby's  arrival. Make sure that you have: ? Diapers. ? Baby clothing. ? Feeding equipment. ? Safe sleeping arrangements for you and your baby.  Install a car seat in your vehicle. Have your car seat checked by a certified car seat installer to make sure that it is installed safely.  Think about who will help you with your new baby at home for at least the first several weeks after delivery.  What can I expect when I arrive at the birth center or hospital? Once you are in labor and have been admitted into the hospital or birth center, your health care provider may:  Review your pregnancy history and any concerns you have.  Insert an IV tube into one of your veins. This is used to give you fluids and medicines.  Check your blood pressure, pulse, temperature, and heart rate (vital signs).  Check whether your bag of water (amniotic sac) has broken (ruptured).  Talk with you about your birth plan and discuss pain control options.  Monitoring Your health care provider may monitor your contractions (uterine monitoring) and your baby's heart rate (fetal monitoring). You may need to be monitored:  Often, but not continuously (intermittently).  All the time or for long periods at a time (continuously). Continuous monitoring may be needed if: ? You are taking certain medicines, such as medicine to relieve pain or make your contractions stronger. ? You have pregnancy or labor complications.  Monitoring may be done by:  Placing a special stethoscope or a handheld monitoring device on your abdomen to   check your baby's heartbeat, and feeling your abdomen for contractions. This method of monitoring does not continuously record your baby's heartbeat or your contractions.  Placing monitors on your abdomen (external monitors) to record your baby's heartbeat and the frequency and length of contractions. You may not have to wear external monitors all the time.  Placing monitors inside of your uterus  (internal monitors) to record your baby's heartbeat and the frequency, length, and strength of your contractions. ? Your health care provider may use internal monitors if he or she needs more information about the strength of your contractions or your baby's heart rate. ? Internal monitors are put in place by passing a thin, flexible wire through your vagina and into your uterus. Depending on the type of monitor, it may remain in your uterus or on your baby's head until birth. ? Your health care provider will discuss the benefits and risks of internal monitoring with you and will ask for your permission before inserting the monitors.  Telemetry. This is a type of continuous monitoring that can be done with external or internal monitors. Instead of having to stay in bed, you are able to move around during telemetry. Ask your health care provider if telemetry is an option for you.  Physical exam Your health care provider may perform a physical exam. This may include:  Checking whether your baby is positioned: ? With the head toward your vagina (head-down). This is most common. ? With the head toward the top of your uterus (head-up or breech). If your baby is in a breech position, your health care provider may try to turn your baby to a head-down position so you can deliver vaginally. If it does not seem that your baby can be born vaginally, your provider may recommend surgery to deliver your baby. In rare cases, you may be able to deliver vaginally if your baby is head-up (breech delivery). ? Lying sideways (transverse). Babies that are lying sideways cannot be delivered vaginally.  Checking your cervix to determine: ? Whether it is thinning out (effacing). ? Whether it is opening up (dilating). ? How low your baby has moved into your birth canal.  What are the three stages of labor and delivery?  Normal labor and delivery is divided into the following three stages: Stage 1  Stage 1 is the  longest stage of labor, and it can last for hours or days. Stage 1 includes: ? Early labor. This is when contractions may be irregular, or regular and mild. Generally, early labor contractions are more than 10 minutes apart. ? Active labor. This is when contractions get longer, more regular, more frequent, and more intense. ? The transition phase. This is when contractions happen very close together, are very intense, and may last longer than during any other part of labor.  Contractions generally feel mild, infrequent, and irregular at first. They get stronger, more frequent (about every 2-3 minutes), and more regular as you progress from early labor through active labor and transition.  Many women progress through stage 1 naturally, but you may need help to continue making progress. If this happens, your health care provider may talk with you about: ? Rupturing your amniotic sac if it has not ruptured yet. ? Giving you medicine to help make your contractions stronger and more frequent.  Stage 1 ends when your cervix is completely dilated to 4 inches (10 cm) and completely effaced. This happens at the end of the transition phase. Stage 2  Once   your cervix is completely effaced and dilated to 4 inches (10 cm), you may start to feel an urge to push. It is common for the body to naturally take a rest before feeling the urge to push, especially if you received an epidural or certain other pain medicines. This rest period may last for up to 1-2 hours, depending on your unique labor experience.  During stage 2, contractions are generally less painful, because pushing helps relieve contraction pain. Instead of contraction pain, you may feel stretching and burning pain, especially when the widest part of your baby's head passes through the vaginal opening (crowning).  Your health care provider will closely monitor your pushing progress and your baby's progress through the vagina during stage 2.  Your  health care provider may massage the area of skin between your vaginal opening and anus (perineum) or apply warm compresses to your perineum. This helps it stretch as the baby's head starts to crown, which can help prevent perineal tearing. ? In some cases, an incision may be made in your perineum (episiotomy) to allow the baby to pass through the vaginal opening. An episiotomy helps to make the opening of the vagina larger to allow more room for the baby to fit through.  It is very important to breathe and focus so your health care provider can control the delivery of your baby's head. Your health care provider may have you decrease the intensity of your pushing, to help prevent perineal tearing.  After delivery of your baby's head, the shoulders and the rest of the body generally deliver very quickly and without difficulty.  Once your baby is delivered, the umbilical cord may be cut right away, or this may be delayed for 1-2 minutes, depending on your baby's health. This may vary among health care providers, hospitals, and birth centers.  If you and your baby are healthy enough, your baby may be placed on your chest or abdomen to help maintain the baby's temperature and to help you bond with each other. Some mothers and babies start breastfeeding at this time. Your health care team will dry your baby and help keep your baby warm during this time.  Your baby may need immediate care if he or she: ? Showed signs of distress during labor. ? Has a medical condition. ? Was born too early (prematurely). ? Had a bowel movement before birth (meconium). ? Shows signs of difficulty transitioning from being inside the uterus to being outside of the uterus. If you are planning to breastfeed, your health care team will help you begin a feeding. Stage 3  The third stage of labor starts immediately after the birth of your baby and ends after you deliver the placenta. The placenta is an organ that develops  during pregnancy to provide oxygen and nutrients to your baby in the womb.  Delivering the placenta may require some pushing, and you may have mild contractions. Breastfeeding can stimulate contractions to help you deliver the placenta.  After the placenta is delivered, your uterus should tighten (contract) and become firm. This helps to stop bleeding in your uterus. To help your uterus contract and to control bleeding, your health care provider may: ? Give you medicine by injection, through an IV tube, by mouth, or through your rectum (rectally). ? Massage your abdomen or perform a vaginal exam to remove any blood clots that are left in your uterus. ? Empty your bladder by placing a thin, flexible tube (catheter) into your bladder. ? Encourage   you to breastfeed your baby. After labor is over, you and your baby will be monitored closely to ensure that you are both healthy until you are ready to go home. Your health care team will teach you how to care for yourself and your baby. This information is not intended to replace advice given to you by your health care provider. Make sure you discuss any questions you have with your health care provider. Document Released: 03/23/2008 Document Revised: 01/02/2016 Document Reviewed: 06/29/2015 Elsevier Interactive Patient Education  2018 [employer]  

## 2017-11-17 ENCOUNTER — Other Ambulatory Visit: Payer: Medicaid Other

## 2017-11-17 ENCOUNTER — Ambulatory Visit (INDEPENDENT_AMBULATORY_CARE_PROVIDER_SITE_OTHER): Payer: Medicaid Other | Admitting: Obstetrics and Gynecology

## 2017-11-17 ENCOUNTER — Encounter: Payer: Medicaid Other | Admitting: Obstetrics and Gynecology

## 2017-11-17 ENCOUNTER — Ambulatory Visit (INDEPENDENT_AMBULATORY_CARE_PROVIDER_SITE_OTHER): Payer: Medicaid Other

## 2017-11-17 VITALS — BP 104/64 | Wt 213.0 lb

## 2017-11-17 DIAGNOSIS — O0993 Supervision of high risk pregnancy, unspecified, third trimester: Secondary | ICD-10-CM | POA: Diagnosis not present

## 2017-11-17 DIAGNOSIS — Z3689 Encounter for other specified antenatal screening: Secondary | ICD-10-CM | POA: Diagnosis not present

## 2017-11-17 DIAGNOSIS — Z3A37 37 weeks gestation of pregnancy: Secondary | ICD-10-CM | POA: Insufficient documentation

## 2017-11-17 DIAGNOSIS — O09529 Supervision of elderly multigravida, unspecified trimester: Secondary | ICD-10-CM

## 2017-11-17 DIAGNOSIS — O0990 Supervision of high risk pregnancy, unspecified, unspecified trimester: Secondary | ICD-10-CM

## 2017-11-17 DIAGNOSIS — K219 Gastro-esophageal reflux disease without esophagitis: Secondary | ICD-10-CM

## 2017-11-17 LAB — FETAL NONSTRESS TEST

## 2017-11-17 NOTE — Progress Notes (Signed)
    Routine Prenatal Care Visit  Subjective  Karina Franco is a 42 y.o. G5P4004 at [redacted]w[redacted]d being seen today for ongoing prenatal care.  She is currently monitored for the following issues for this high-risk pregnancy and has Tobacco smoking affecting pregnancy, antepartum; Antepartum multigravida of advanced maternal age; Supervision of high risk pregnancy, antepartum; Gastroesophageal reflux disease without esophagitis; Candida infection; and Encounter for dental exam and cleaning w/o abnormal findings on their problem list.  ----------------------------------------------------------------------------------- Patient reports no complaints.   Contractions: Not present. Vag. Bleeding: None.  Movement: Present. Denies leaking of fluid.  ----------------------------------------------------------------------------------- The following portions of the patient's history were reviewed and updated as appropriate: allergies, current medications, past family history, past medical history, past social history, past surgical history and problem list. Problem list updated.   Objective  Blood pressure 104/64, weight 213 lb (96.6 kg), last menstrual period 02/28/2017. Pregravid weight 165 lb (74.8 kg) Total Weight Gain 48 lb (21.8 kg) Urinalysis: Urine Protein: Negative Urine Glucose: Negative  Fetal Status: Fetal Heart Rate (bpm): 140 Fundal Height: 39 cm Movement: Present     General:  Alert, oriented and cooperative. Patient is in no acute distress.  Skin: Skin is warm and dry. No rash noted.   Cardiovascular: Normal heart rate noted  Respiratory: Normal respiratory effort, no problems with respiration noted  Abdomen: Soft, gravid, appropriate for gestational age. Pain/Pressure: Absent     Pelvic:  Cervical exam deferred        Extremities: Normal range of motion.     ental Status: Normal mood and affect. Normal behavior. Normal judgment and thought content.     Assessment   42 y.o. [name] at  [redacted]w[redacted]d by  12/05/2017, by Last Menstrual Period presenting for routine prenatal visit  Plan   pregnancy#5 Problems (from 02/28/17 to present)    Problem Noted Resolved   Supervision of high risk pregnancy, antepartum 06/30/2017 by Tresea Mall, CNM No   Overview Addendum 09/12/2017 10:48 AM by Natale Milch, MD    [facility] Prenatal Labs  Dating L=19 Blood type: A/Positive/-- (01/03 1140)   Genetic Screen Declined Antibody:Negative (01/03 1140)  Anatomic [place] Follow up for cardiac, diaphragm, nose/lips Rubella: 2.24 (01/03 1140) Varicella: Immune  GTT  Third trimester:  RPR: Non Reactive (01/03 1140)   Rhogam  not needed HBsAg: Negative (01/03 1140)   TDaP vaccine     Flu Shot: declines HIV: Non Reactive (01/03 1140)   Baby Food  Bottle                              GBS:  Hx of infant with GBS pneumonia, treat patient in labor  Contraception Plans BTL Pap: NIL 2019  CBB   Given information   CS/VBAC NA   Support Person John              Gestational age appropriate obstetric precautions including but not limited to vaginal bleeding, contractions, leaking of fluid and fetal movement were reviewed in detail with the patient.    NST reactive 145 baseline, moderate variability, no decelerations, 15 x15 accelerations more than 2. Category I.   Return for ROB twice a week as scheduled.  Adelene Idler MD [facility], [facility] 11/17/17 10:15 AM

## 2017-11-17 NOTE — Progress Notes (Signed)
Pt c/o numbness and tingling in left left. NST/AFI today.

## 2017-11-19 ENCOUNTER — Encounter: Payer: Self-pay | Admitting: Emergency Medicine

## 2017-11-19 ENCOUNTER — Other Ambulatory Visit: Payer: Self-pay

## 2017-11-19 ENCOUNTER — Observation Stay
Admission: EM | Admit: 2017-11-19 | Discharge: 2017-11-19 | Disposition: A | Discharge status: Home / Self Care | Payer: Medicaid Other | Attending: Obstetrics and Gynecology | Admitting: Obstetrics and Gynecology

## 2017-11-19 DIAGNOSIS — O471 False labor at or after 37 completed weeks of gestation: Secondary | ICD-10-CM | POA: Insufficient documentation

## 2017-11-19 DIAGNOSIS — O09523 Supervision of elderly multigravida, third trimester: Secondary | ICD-10-CM | POA: Diagnosis not present

## 2017-11-19 DIAGNOSIS — Z3A37 37 weeks gestation of pregnancy: Secondary | ICD-10-CM | POA: Insufficient documentation

## 2017-11-19 DIAGNOSIS — Z3A38 38 weeks gestation of pregnancy: Secondary | ICD-10-CM

## 2017-11-19 LAB — URINALYSIS, COMPLETE (UACMP) WITH MICROSCOPIC
Bilirubin Urine: NEGATIVE
Glucose, UA: NEGATIVE mg/dL
Hgb urine dipstick: NEGATIVE
Ketones, ur: NEGATIVE mg/dL
Leukocytes, UA: NEGATIVE
Nitrite: NEGATIVE
Protein, ur: NEGATIVE mg/dL
Specific Gravity, Urine: 1.011 (ref 1.005–1.030)
pH: 6 (ref 5.0–8.0)

## 2017-11-19 LAB — CBC
HCT: 33 % — ABNORMAL LOW (ref 35.0–47.0)
HEMOGLOBIN: 11.7 g/dL — AB (ref 12.0–16.0)
MCH: 32.7 pg (ref 26.0–34.0)
MCHC: 35.4 g/dL (ref 32.0–36.0)
MCV: 92.4 fL (ref 80.0–100.0)
PLATELETS: 155 10*3/uL (ref 150–440)
RBC: 3.57 MIL/uL — AB (ref 3.80–5.20)
RDW: 13.4 % (ref 11.5–14.5)
WBC: 7.7 10*3/uL (ref 3.6–11.0)

## 2017-11-19 LAB — COMPREHENSIVE METABOLIC PANEL
ALT: 10 U/L — AB (ref 14–54)
ANION GAP: 7 (ref 5–15)
AST: 21 U/L (ref 15–41)
Albumin: 2.7 g/dL — ABNORMAL LOW (ref 3.5–5.0)
Alkaline Phosphatase: 121 U/L (ref 38–126)
BILIRUBIN TOTAL: 0.4 mg/dL (ref 0.3–1.2)
BUN: 7 mg/dL (ref 6–20)
CALCIUM: 8.8 mg/dL — AB (ref 8.9–10.3)
CO2: 23 mmol/L (ref 22–32)
CREATININE: 0.43 mg/dL — AB (ref 0.44–1.00)
Chloride: 105 mmol/L (ref 101–111)
GFR calc non Af Amer: >60 mL/min (ref >60)
GLUCOSE: 89 mg/dL (ref 65–99)
Potassium: 3.7 mmol/L (ref 3.5–5.1)
Sodium: 135 mmol/L (ref 135–145)
TOTAL PROTEIN: 6.3 g/dL — AB (ref 6.5–8.1)

## 2017-11-19 LAB — LIPASE, BLOOD: Lipase: 32 U/L (ref 11–51)

## 2017-11-19 MED ORDER — NITROFURANTOIN MONOHYD MACRO 100 MG PO CAPS
100.0000 mg | ORAL_CAPSULE | Freq: Two times a day (BID) | ORAL | Status: DC
  Administered 2017-11-19: 100 mg via ORAL
  Filled 2017-11-19: qty 1

## 2017-11-19 MED ORDER — ACETAMINOPHEN 500 MG PO TABS
1000.0000 mg | ORAL_TABLET | Freq: Four times a day (QID) | ORAL | Status: DC | PRN
  Administered 2017-11-19: 1000 mg via ORAL
  Filled 2017-11-19: qty 2

## 2017-11-19 NOTE — Final Progress Note (Signed)
History & Physical  Karina Franco is an 42 y.o. female.  HPI: Patient presents to labor and delivery complaining of sharp left lower quadrant pain and pain in her groin. It feels constant and sharp. She does not have any other associated symptoms. She does not feel like the pain is similar to contractions. She had a normal stool today. She denies nausea or vomiting. She has not had any water to drink in several days. She drinks sweet tea, juice, and soda at home. She denies extensive heat exposure today. Reports good fetal movement. Denies vaginal bleeding, leakage of fluid or abnormal discharge. She has not been taking antibiotics for her UTI ecause she can not afford them.   Past Medical History:  Diagnosis Date  . 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 TMJ (dislocation of temporomandibular joint)     Past Surgical History:  Procedure Laterality Date  . TONSILLECTOMY      No family history on file.  Social History:  reports that she quit smoking about 3 months ago. Her smoking use included cigarettes. She has a 62.00 pack-year smoking history. She has never used smokeless tobacco. She reports that she does not drink alcohol or use drugs.  Allergies: No Known Allergies  Medications: I have reviewed the patient's current medications.  Results for orders placed or performed during the hospital encounter of 11/19/17 (from the past 48 hour(s))  Lipase, blood     Status: None   Collection Time: 11/19/17  6:56 PM  Result Value Ref Range   Lipase 32 11 - 51 U/L    Comment: Performed at [facility], [address] [place], [place] [address]  Comprehensive metabolic panel     Status: Abnormal   Collection Time: 11/19/17  6:56 PM  Result Value Ref Range   Sodium 135 135 - 145 mmol/L   Potassium 3.7 3.5 - 5.1 mmol/L   Chloride 105 101 - 111 mmol/L   CO2 23 22 - 32 mmol/L   Glucose, Bld 89 65 - 99 mg/dL   BUN 7 6 - 20 mg/dL   Creatinine, Ser 0.43 (L) 0.44 - 1.00 mg/dL    Calcium 8.8 (L) 8.9 - 10.3 mg/dL   Total Protein 6.3 (L) 6.5 - 8.1 g/dL   Albumin 2.7 (L) 3.5 - 5.0 g/dL   AST 21 15 - 41 U/L   ALT 10 (L) 14 - 54 U/L   Alkaline Phosphatase 121 38 - 126 U/L   Total Bilirubin 0.4 0.3 - 1.2 mg/dL   GFR calc non Af Amer >60 >60 mL/min   GFR calc Af Amer >60 >60 mL/min    Comment: (NOTE) The eGFR has been calculated using the CKD EPI equation. This calculation has not been validated in all clinical situations. eGFR's persistently <60 mL/min signify possible Chronic Kidney Disease.    Anion gap 7 5 - 15    Comment: Performed at [facility], [address] [place], [place] [address]  CBC     Status: Abnormal   Collection Time: 11/19/17  6:56 PM  Result Value Ref Range   WBC 7.7 3.6 - 11.0 K/uL   RBC 3.57 (L) 3.80 - 5.20 MIL/uL   Hemoglobin 11.7 (L) 12.0 - 16.0 g/dL   HCT 33.0 (L) 35.0 - 47.0 %   MCV 92.4 80.0 - 100.0 fL   MCH 32.7 26.0 - 34.0 pg   MCHC 35.4 32.0 - 36.0 g/dL   RDW 13.4 11.5 - 14.5 %  Platelets 155 150 - 440 K/uL    Comment: Performed at [facility], [address] [place], [place] [address]  Urinalysis, Complete w Microscopic     Status: Abnormal   Collection Time: 11/19/17  7:36 PM  Result Value Ref Range   Color, Urine YELLOW (A) YELLOW   APPearance CLEAR (A) CLEAR   Specific Gravity, Urine 1.011 1.005 - 1.030   pH 6.0 5.0 - 8.0   Glucose, UA NEGATIVE NEGATIVE mg/dL   Hgb urine dipstick NEGATIVE NEGATIVE   Bilirubin Urine NEGATIVE NEGATIVE   Ketones, ur NEGATIVE NEGATIVE mg/dL   Protein, ur NEGATIVE NEGATIVE mg/dL   Nitrite NEGATIVE NEGATIVE   Leukocytes, UA NEGATIVE NEGATIVE   RBC / HPF 0-5 0 - 5 RBC/hpf   WBC, UA 0-5 0 - 5 WBC/hpf   Bacteria, UA RARE (A) NONE SEEN   Squamous Epithelial / LPF 0-5 0 - 5   Mucus PRESENT     Comment: Performed at [facility], [address] [place], [place] [address]    No results found.  Review of Systems  Constitutional: Negative for chills,  fever, malaise/fatigue and weight loss.  HENT: Negative for congestion, hearing loss and sinus pain.   Eyes: Negative for blurred vision and double vision.  Respiratory: Negative for cough, sputum production, shortness of breath and wheezing.   Cardiovascular: Negative for chest pain, palpitations, orthopnea and leg swelling.  Gastrointestinal: Negative for abdominal pain, constipation, diarrhea, nausea and vomiting.  Genitourinary: Negative for dysuria, flank pain, frequency, hematuria and urgency.  Musculoskeletal: Negative for back pain, falls and joint pain.  Skin: Negative for itching and rash.  Neurological: Negative for dizziness and headaches.  Psychiatric/Behavioral: Negative for depression, substance abuse and suicidal ideas. The patient is not nervous/anxious.    Blood pressure 118/82, pulse 95, temperature 98 F (36.7 C), temperature source Oral, resp. rate 16, weight 213 lb (96.6 kg), last menstrual period 02/28/2017, SpO2 95 %. Physical Exam  Nursing note and vitals reviewed. Constitutional: She is oriented to person, place, and time. She appears well-developed and well-nourished.  HENT:  Head: Normocephalic and atraumatic.  Cardiovascular: Normal rate and regular rhythm.  Respiratory: Effort normal and breath sounds normal.  GI: Soft. Bowel sounds are normal.  Genitourinary:  Genitourinary Comments:  NST: 140s bpm baseline, moderate variability, multiple 15x15 accelerations, no decelerations. Category I [place]; irregular  SVE 2/thick/-3  Musculoskeletal: Normal range of motion.  Neurological: She is alert and oriented to person, place, and time.  Skin: Skin is warm and dry.  Psychiatric: She has a normal mood and affect. Her behavior is normal. Judgment and thought content normal.    Assessment/Plan: 42 yo with vague musculoskeletal complaints. No signs of labor. Treated with oral hydration, tylenol, and a heating pad. Patient given one dose of antibiotics . Patient  discharged home in stable condition.   Care manager messaged in Epic to help patient obtain antibiotics at no cost.  Resa Rinks R Zakyria Metzinger 11/19/2017, 8:37 PM

## 2017-11-19 NOTE — Discharge Instructions (Signed)
[email or web address]://[employer].com/learn-more/techniques/other-techniques/techniques-for-[email or web address]-pain/

## 2017-11-19 NOTE — Discharge Summary (Signed)
Please see final progress note. Adelene Idler MD [facility] 11/19/17 8:49 PM

## 2017-11-19 NOTE — OB Triage Note (Signed)
Pt states she has been having lower left abdominal pain since 9:30a this morning. Nothing makes it worse or better. No leaking fluid or vaginal bleeding. Still feeling baby moving all day.

## 2017-11-19 NOTE — ED Triage Notes (Signed)
Pt to ED via POV c/o LLQ abdominal pain that started this morning around 0930. Pt denies N/V/D. Pt denies fever or chills. Pt is in NAD at this time.

## 2017-11-22 ENCOUNTER — Ambulatory Visit (INDEPENDENT_AMBULATORY_CARE_PROVIDER_SITE_OTHER): Payer: Medicaid Other | Admitting: Obstetrics and Gynecology

## 2017-11-22 VITALS — BP 110/70 | Wt 215.0 lb

## 2017-11-22 DIAGNOSIS — Z3A38 38 weeks gestation of pregnancy: Secondary | ICD-10-CM

## 2017-11-22 DIAGNOSIS — O0990 Supervision of high risk pregnancy, unspecified, unspecified trimester: Secondary | ICD-10-CM

## 2017-11-22 DIAGNOSIS — O09529 Supervision of elderly multigravida, unspecified trimester: Secondary | ICD-10-CM

## 2017-11-22 NOTE — Progress Notes (Signed)
ROB NST 

## 2017-11-22 NOTE — Progress Notes (Signed)
Routine Prenatal Care Visit  Subjective  Karina Franco is a 42 y.o. G5P4004 at 107w1d being seen today for ongoing prenatal care.  She is currently monitored for the following issues for this high-risk pregnancy and has Tobacco smoking affecting pregnancy, antepartum; Antepartum multigravida of advanced maternal age; Supervision of high risk pregnancy, antepartum; Gastroesophageal reflux disease without esophagitis; Candida infection; Encounter for dental exam and cleaning w/o abnormal findings; and [redacted] weeks gestation of pregnancy on their problem list.  ----------------------------------------------------------------------------------- Patient reports no complaints.   Contractions: Not present. Vag. Bleeding: None.  Movement: Present. Denies leaking of fluid.  ----------------------------------------------------------------------------------- The following portions of the patient's history were reviewed and updated as appropriate: allergies, current medications, past family history, past medical history, past social history, past surgical history and problem list. Problem list updated.   Objective  Blood pressure 110/70, weight 215 lb (97.5 kg), last menstrual period 02/28/2017. Pregravid weight 165 lb (74.8 kg) Total Weight Gain 50 lb (22.7 kg) Urinalysis: Urine Protein: Negative Urine Glucose: Negative  Fetal Status: Fetal Heart Rate (bpm): 140 Fundal Height: 39 cm Movement: Present     General:  Alert, oriented and cooperative. Patient is in no acute distress.  Skin: Skin is warm and dry. No rash noted.   Cardiovascular: Normal heart rate noted  Respiratory: Normal respiratory effort, no problems with respiration noted  Abdomen: Soft, gravid, appropriate for gestational age. Pain/Pressure: Absent     Pelvic:  Cervical exam deferred        Extremities: Normal range of motion.     ental Status: Normal mood and affect. Normal behavior. Normal judgment and thought content.   US Ob  Limited  Result Date: 11/11/2017 ULTRASOUND REPORT Patient Name: Karina Franco DOB: 07-21-1975 MRN: [id number] Location: [facility] Date of Service: 11/09/2017 Indications:AFI Findings: Mason Jim intrauterine pregnancy is visualized with FHR at 139 BPM. Fetal presentation is Cephalic. Placenta: .Anterior, grade 1 AFI: 12.79 cm Impression: 1. [redacted]w[redacted]d Viable Singleton Intrauterine pregnancy dated by previously established criteria. 2. AFI is 12.79 cm. Recommendations: 1.Clinical correlation with the patient's History and Physical Exam. Willette Alma, RDMS, RVT There is a singleton gestation with normal amniotic fluid volume. Vena Austria, MD, [facility], [facility] 11/11/2017, 1:35 PM   US Ob Follow Up  Result Date: 11/17/2017 Patient Name: Karina Franco DOB: 1975-10-20 MRN: [id number] ULTRASOUND REPORT Location: [facility] Date of Service: 11/17/2017 Indications:growth/afi Findings: Mason Jim intrauterine pregnancy is visualized with FHR at 152 BPM. Biometrics give an (U/S) Gestational age of [redacted]w[redacted]d and an (U/S) EDD of 12/10/17; this correlates with the clinically established Estimated Date of Delivery: 12/05/17. Fetal presentation is Cephalic. Placenta: anterior,grade 2. AFI: 9.39 cm Growth percentile is 42.4%. EFW: 6lb11oz Impression: 1. [redacted]w[redacted]d Viable Singleton Intrauterine pregnancy previously established criteria. 2. Growth is 42.4 %ile.  AFI is 9.39 cm. Recommendations: 1.Clinical correlation with the patient's History and Physical Exam. Abeer Alsammarraie RDMS I have reviewed this ultrasound and the report. I agree with the above assessment and plan. Adelene Idler MD [facility] 11/17/17 1:19 PM    US Ob Follow Up  Result Date: 11/10/2017 Patient Name: Karina Franco DOB: Nov 13, 1975 MRN: [id number] ULTRASOUND REPORT Location: [facility] Date of Service: 10/28/2017 Indications:growth/afi Findings: Mason Jim intrauterine pregnancy  is visualized with FHR at 137 BPM. Biometrics give an (U/S) Gestational age of [redacted]w[redacted]d and an (U/S) EDD of 12/05/2017; this correlates with the clinically established Estimated Date of Delivery: 12/05/17. Fetal presentation is Cephalic. Placenta: Anterior, Grade 2.  AFI: 16.26 cm Growth percentile is 57.6%. EFW: 2636 g (5lb 13oz) Impression: 1. [redacted]w[redacted]d Viable Singleton Intrauterine pregnancy previously established criteria. 2. Growth is 57.6 %ile.  AFI is 16.26 cm. Recommendations: 1.Clinical correlation with the patient's History and Physical Exam. Mital bahen Leodis Binet, RDMS The ultrasound images and findings were reviewed by me and I agree with the above report. Thomasene Mohair, MD, FACOG [facility], [facility] 11/10/2017 1:37 PM    Baseline:140 Variability: moderate Accelerations: present Decelerations: absent Tocometry: N/A The patient was monitored for 30 minutes, fetal heart rate tracing was deemed reactive, category I tracing,   Assessment   42 y.o. [id number] at [redacted]w[redacted]d by  12/05/2017, by Last Menstrual Period presenting for routine prenatal visit  Plan   pregnancy#5 Problems (from 02/28/17 to present)    Problem Noted Resolved   Supervision of high risk pregnancy, antepartum 06/30/2017 by Tresea Mall, CNM No   Overview Addendum 09/12/2017 10:48 AM by Natale Milch, MD    [facility]inic [facility] Prenatal Labs  Dating L=19 Blood type: A/Positive/-- (01/03 1140)   Genetic Screen Declined Antibody:Negative (01/03 1140)  Anatomic [place] Follow up for cardiac, diaphragm, nose/lips Rubella: 2.24 (01/03 1140) Varicella: Immune  GTT  Third trimester:  RPR: Non Reactive (01/03 1140)   Rhogam  not needed HBsAg: Negative (01/03 1140)   TDaP vaccine     Flu Shot: declines HIV: Non Reactive (01/03 1140)   Baby Food  Bottle                              GBS:  Hx of infant with GBS pneumonia, treat patient in labor  Contraception Plans BTL Pap: NIL 2019  CBB   Given information   CS/VBAC NA    Support Person John              Gestational age appropriate obstetric precautions including but not limited to vaginal bleeding, contractions, leaking of fluid and fetal movement were reviewed in detail with the patient.    Return if symptoms worsen or fail to improve.  Vena Austria, MD, [facility], [facility] 11/22/2017, 2:34 PM

## 2017-11-25 ENCOUNTER — Ambulatory Visit (INDEPENDENT_AMBULATORY_CARE_PROVIDER_SITE_OTHER): Payer: Medicaid Other

## 2017-11-25 ENCOUNTER — Ambulatory Visit (INDEPENDENT_AMBULATORY_CARE_PROVIDER_SITE_OTHER): Payer: Medicaid Other | Admitting: Maternal Newborn

## 2017-11-25 ENCOUNTER — Encounter: Payer: Self-pay | Admitting: Maternal Newborn

## 2017-11-25 VITALS — BP 120/80 | Wt 216.0 lb

## 2017-11-25 DIAGNOSIS — O0990 Supervision of high risk pregnancy, unspecified, unspecified trimester: Secondary | ICD-10-CM

## 2017-11-25 DIAGNOSIS — O09523 Supervision of elderly multigravida, third trimester: Secondary | ICD-10-CM | POA: Diagnosis not present

## 2017-11-25 DIAGNOSIS — L309 Dermatitis, unspecified: Secondary | ICD-10-CM

## 2017-11-25 DIAGNOSIS — O99333 Smoking (tobacco) complicating pregnancy, third trimester: Secondary | ICD-10-CM | POA: Diagnosis not present

## 2017-11-25 DIAGNOSIS — O0993 Supervision of high risk pregnancy, unspecified, third trimester: Secondary | ICD-10-CM | POA: Diagnosis not present

## 2017-11-25 DIAGNOSIS — R0981 Nasal congestion: Secondary | ICD-10-CM

## 2017-11-25 DIAGNOSIS — J029 Acute pharyngitis, unspecified: Secondary | ICD-10-CM

## 2017-11-25 DIAGNOSIS — Z3A38 38 weeks gestation of pregnancy: Secondary | ICD-10-CM | POA: Diagnosis not present

## 2017-11-25 MED ORDER — TRIAMCINOLONE ACETONIDE 0.1 % EX CREA: 1.0000 "application " | TOPICAL_CREAM | Freq: Two times a day (BID) | CUTANEOUS | 1 refills | Status: DC

## 2017-11-25 MED ORDER — DIPHENHYDRAMINE HCL 50 MG PO TABS: 25.0000 mg | ORAL_TABLET | Freq: Four times a day (QID) | ORAL | 0 refills | Status: DC | PRN

## 2017-11-25 MED ORDER — MENTHOL 5.4 MG MT LOZG: 1.0000 | LOZENGE | Freq: Four times a day (QID) | OROMUCOSAL | 0 refills | Status: DC

## 2017-11-25 NOTE — Progress Notes (Signed)
C/o rash left arm, neck, chest - would like something called in.rj

## 2017-11-25 NOTE — Progress Notes (Signed)
Routine Prenatal Care Visit  Subjective  Karina Franco is a 42 y.o. G5P4004 at 4045w4d being seen today for ongoing prenatal care.  She is currently monitored for the following issues for this high-risk pregnancy and has Tobacco smoking affecting pregnancy, antepartum; Antepartum multigravida of advanced maternal age; Supervision of high risk pregnancy, antepartum; Gastroesophageal reflux disease without esophagitis; Candida infection; Encounter for dental exam and cleaning w/o abnormal findings; and [redacted] weeks gestation of pregnancy on their problem list.  ---------------------------------------------------------------------------------- Patient reports rash on chest and arm and symptoms of viral illness (cough, sore throat, nasal congestion).   Contractions: Not present. Vag. Bleeding: None.  Movement: Present. No leaking of fluid.  ---------------------------------------------------------------------------------- The following portions of the patient's history were reviewed and updated as appropriate: allergies, current medications, past family history, past medical history, past social history, past surgical history and problem list. Problem list updated.  Objective  Blood pressure 120/80, weight 216 lb (98 kg), last menstrual period 02/28/2017. Pregravid weight 165 lb (74.8 kg) Total Weight Gain 51 lb (23.1 kg) Urinalysis: Urine Protein: Negative Urine Glucose: Negative  Fetal Status: Fetal Heart Rate (bpm): 135 Fundal Height: 40 cm Movement: Present  Presentation: Vertex  General:  Alert, oriented and cooperative. Patient is in no acute distress.  Skin: Skin is warm and dry. Rash noted on left arm and upper chest.   Cardiovascular: Normal heart rate noted  Respiratory: Normal respiratory effort, no problems with respiration noted  Abdomen: Soft, gravid, appropriate for gestational age. Pain/Pressure: Absent     Pelvic:  Cervical exam performed Dilation: 2.5 Effacement (%): 40  Station: -2  Extremities: Normal range of motion.     Mental Status: Normal mood and affect. Normal behavior. Normal judgment and thought content.   NST Baseline: 135 Variability: moderate Accelerations: present Decelerations: absent Tocometry: none The patient was monitored for 20 minutes, fetal heart rate tracing was deemed reactive, category I tracing.  Assessment   42 y.o. [id number] at 245w4d, EDD 12/05/2017 by Last Menstrual Period presenting for routine prenatal visit.  Plan   pregnancy#5 Problems (from 02/28/17 to present)    Problem Noted Resolved   Supervision of high risk pregnancy, antepartum 06/30/2017 by Tresea MallGledhill, Jane, CNM No   Overview Addendum 09/12/2017 10:48 AM by Natale MilchSchuman, Christanna R, MD    [facility] Prenatal Labs  Dating L=19 Blood type: A/Positive/-- (01/03 1140)   Genetic Screen Declined Antibody:Negative (01/03 1140)  Anatomic US Complete 08/15/17 Rubella: 2.24 (01/03 1140) Varicella: Immune  GTT  Third trimester:  RPR: Non Reactive (01/03 1140)   Rhogam  not needed HBsAg: Negative (01/03 1140)   TDaP vaccine     Flu Shot: declines HIV: Non Reactive (01/03 1140)   Baby Food  Bottle                              GBS:  Hx of infant with GBS pneumonia, treat patient in labor  Contraception Plans BTL Pap: NIL 2019  CBB   Given information   CS/VBAC NA   Support Person John           Reactive NST today, AFI 11.01 cm.  Sent medications to pharmacy for rash (looks like poison ivy) and cold symptoms.  Term labor symptoms and general obstetric precautions including but not limited to vaginal bleeding, contractions, leaking of fluid and fetal movement were reviewed.  IOL 6/4 at 1200 pm.  Marcelyn BruinsJacelyn Schmid, CNM 11/28/2017  8:00 AM

## 2017-11-29 ENCOUNTER — Inpatient Hospital Stay
Admission: EM | Admit: 2017-11-29 | Discharge: 2017-12-03 | DRG: 797 | Disposition: A | Discharge status: Home / Self Care | Payer: Medicaid Other | Attending: Obstetrics and Gynecology | Admitting: Obstetrics and Gynecology

## 2017-11-29 ENCOUNTER — Inpatient Hospital Stay: Payer: Medicaid Other | Admitting: Anesthesiology

## 2017-11-29 ENCOUNTER — Other Ambulatory Visit: Payer: Self-pay

## 2017-11-29 DIAGNOSIS — D62 Acute posthemorrhagic anemia: Secondary | ICD-10-CM | POA: Diagnosis not present

## 2017-11-29 DIAGNOSIS — O99214 Obesity complicating childbirth: Principal | ICD-10-CM | POA: Diagnosis present

## 2017-11-29 DIAGNOSIS — Z302 Encounter for sterilization: Secondary | ICD-10-CM

## 2017-11-29 DIAGNOSIS — O0990 Supervision of high risk pregnancy, unspecified, unspecified trimester: Secondary | ICD-10-CM

## 2017-11-29 DIAGNOSIS — K219 Gastro-esophageal reflux disease without esophagitis: Secondary | ICD-10-CM

## 2017-11-29 DIAGNOSIS — O26893 Other specified pregnancy related conditions, third trimester: Secondary | ICD-10-CM | POA: Diagnosis present

## 2017-11-29 DIAGNOSIS — Z3A39 39 weeks gestation of pregnancy: Secondary | ICD-10-CM

## 2017-11-29 DIAGNOSIS — R072 Precordial pain: Secondary | ICD-10-CM | POA: Diagnosis not present

## 2017-11-29 DIAGNOSIS — R030 Elevated blood-pressure reading, without diagnosis of hypertension: Secondary | ICD-10-CM | POA: Diagnosis not present

## 2017-11-29 DIAGNOSIS — Z87891 Personal history of nicotine dependence: Secondary | ICD-10-CM

## 2017-11-29 DIAGNOSIS — R079 Chest pain, unspecified: Secondary | ICD-10-CM | POA: Diagnosis not present

## 2017-11-29 DIAGNOSIS — O9089 Other complications of the puerperium, not elsewhere classified: Secondary | ICD-10-CM | POA: Diagnosis not present

## 2017-11-29 DIAGNOSIS — O9081 Anemia of the puerperium: Secondary | ICD-10-CM | POA: Diagnosis not present

## 2017-11-29 DIAGNOSIS — E669 Obesity, unspecified: Secondary | ICD-10-CM | POA: Diagnosis present

## 2017-11-29 DIAGNOSIS — I340 Nonrheumatic mitral (valve) insufficiency: Secondary | ICD-10-CM | POA: Diagnosis not present

## 2017-11-29 LAB — CBC
HCT: 31.7 % — ABNORMAL LOW (ref 35.0–47.0)
Hemoglobin: 11.1 g/dL — ABNORMAL LOW (ref 12.0–16.0)
MCH: 32.1 pg (ref 26.0–34.0)
MCHC: 35 g/dL (ref 32.0–36.0)
MCV: 91.6 fL (ref 80.0–100.0)
Platelets: 147 10*3/uL — ABNORMAL LOW (ref 150–440)
RBC: 3.46 MIL/uL — ABNORMAL LOW (ref 3.80–5.20)
RDW: 13.7 % (ref 11.5–14.5)
WBC: 7.2 10*3/uL (ref 3.6–11.0)

## 2017-11-29 LAB — TYPE AND SCREEN
ABO/RH(D): A POS
ANTIBODY SCREEN: NEGATIVE

## 2017-11-29 MED ORDER — OXYTOCIN 40 UNITS IN LACTATED RINGERS INFUSION - SIMPLE MED: 2.5000 [IU]/h | INTRAVENOUS | Status: DC

## 2017-11-29 MED ORDER — OXYTOCIN 40 UNITS IN LACTATED RINGERS INFUSION - SIMPLE MED
1.0000 m[IU]/min | INTRAVENOUS | Status: DC
  Administered 2017-11-29: 2 m[IU]/min via INTRAVENOUS

## 2017-11-29 MED ORDER — SODIUM CHLORIDE 0.9 % IV SOLN: 5.0000 10*6.[IU] | Freq: Once | INTRAVENOUS | Status: DC

## 2017-11-29 MED ORDER — LACTATED RINGERS IV SOLN
500.0000 mL | INTRAVENOUS | Status: DC | PRN
  Administered 2017-11-29: 1000 mL via INTRAVENOUS

## 2017-11-29 MED ORDER — LIDOCAINE HCL (PF) 1 % IJ SOLN: 30.0000 mL | INTRAMUSCULAR | Status: DC | PRN

## 2017-11-29 MED ORDER — FENTANYL 2.5 MCG/ML W/ROPIVACAINE 0.15% IN NS 100 ML EPIDURAL (ARMC)
12.0000 mL/h | EPIDURAL | Status: DC
  Administered 2017-11-29: 12 mL/h via EPIDURAL

## 2017-11-29 MED ORDER — OXYTOCIN BOLUS FROM INFUSION
500.0000 mL | Freq: Once | INTRAVENOUS | Status: AC
  Administered 2017-11-30: 500 mL via INTRAVENOUS

## 2017-11-29 MED ORDER — FENTANYL 2.5 MCG/ML W/ROPIVACAINE 0.15% IN NS 100 ML EPIDURAL (ARMC)
EPIDURAL | Status: AC
  Filled 2017-11-29: qty 100

## 2017-11-29 MED ORDER — LACTATED RINGERS IV SOLN
INTRAVENOUS | Status: DC
  Administered 2017-11-29: 13:00:00 via INTRAVENOUS

## 2017-11-29 MED ORDER — LACTATED RINGERS IV SOLN
500.0000 mL | Freq: Once | INTRAVENOUS | Status: AC
  Administered 2017-11-29: 500 mL via INTRAVENOUS

## 2017-11-29 MED ORDER — PENICILLIN G POT IN DEXTROSE 60000 UNIT/ML IV SOLN
3.0000 10*6.[IU] | INTRAVENOUS | Status: DC
  Administered 2017-11-29 – 2017-11-30 (×3): 3 10*6.[IU] via INTRAVENOUS
  Filled 2017-11-29 (×6): qty 50

## 2017-11-29 MED ORDER — EPHEDRINE 5 MG/ML INJ: 10.0000 mg | INTRAVENOUS | Status: DC | PRN

## 2017-11-29 MED ORDER — MISOPROSTOL 200 MCG PO TABS: 800.0000 ug | ORAL_TABLET | Freq: Once | ORAL | Status: DC | PRN

## 2017-11-29 MED ORDER — ONDANSETRON HCL 4 MG/2ML IJ SOLN: 4.0000 mg | Freq: Four times a day (QID) | INTRAMUSCULAR | Status: DC | PRN

## 2017-11-29 MED ORDER — DIPHENHYDRAMINE HCL 50 MG/ML IJ SOLN: 12.5000 mg | INTRAMUSCULAR | Status: DC | PRN

## 2017-11-29 MED ORDER — SODIUM CHLORIDE 0.9 % IV SOLN
5.0000 10*6.[IU] | Freq: Once | INTRAVENOUS | Status: AC
  Administered 2017-11-29: 5 10*6.[IU] via INTRAVENOUS
  Filled 2017-11-29: qty 5

## 2017-11-29 MED ORDER — PENICILLIN G POTASSIUM 5000000 UNITS IJ SOLR: 2.5000 10*6.[IU] | INTRAVENOUS | Status: DC

## 2017-11-29 MED ORDER — BUPIVACAINE HCL (PF) 0.25 % IJ SOLN
INTRAMUSCULAR | Status: DC | PRN
  Administered 2017-11-29: 5 mL via EPIDURAL

## 2017-11-29 MED ORDER — PHENYLEPHRINE 40 MCG/ML (10ML) SYRINGE FOR IV PUSH (FOR BLOOD PRESSURE SUPPORT): 80.0000 ug | PREFILLED_SYRINGE | INTRAVENOUS | Status: DC | PRN

## 2017-11-29 MED ORDER — AMMONIA AROMATIC IN INHA
0.3000 mL | Freq: Once | RESPIRATORY_TRACT | Status: DC | PRN
Start: 2017-11-29 — End: 2017-11-30

## 2017-11-29 MED ORDER — TERBUTALINE SULFATE 1 MG/ML IJ SOLN: 0.2500 mg | Freq: Once | INTRAMUSCULAR | Status: DC | PRN

## 2017-11-29 NOTE — H&P (Addendum)
OB History & Physical   History of Present Illness:  Chief Complaint:   HPI:  Karina PenningRachelle Franco is a 42 y.o. 865P4004 female with EDC=12/05/2017 at 3518w1d dated by LMP=19week ultrasound.  Her pregnancy has been complicated by late presentation for care, AMA ( declined genetic testing), a prior hx of baby with GBS pneumonia, weight gain of 48#, an abnormal 3hr GTT with 1/4elevated levels, GERD, and obesity (current BMI=34.38 kg/m2). She was also told that she had a "leaking heart valve" when [place] in [place] chest pain. She has also had a Stress test in [place] for palpitations. Irregular heart rate comes and goes. Was given a medication for her palpitations, but did not seem to help her symptoms, so she stopped taking the medicationShe smoked in the first half of her pregnancy, quit 4 months ago.  Has had reactive NSTs and normal AFIs. She presents to L&D for induction of labor.   [facility] has also been remarkable for  [facility] Prenatal Labs  Dating L=19 Blood type: A/Positive/-- (01/03 1140)   Genetic Screen Declined Antibody:Negative (01/03 1140)  Anatomic US normal Rubella: 2.24 (01/03 1140) Varicella: Immune  GTT  Third trimester: 88/179/157/33 RPR: Non Reactive (01/03 1140)   Rhogam  not needed HBsAg: Negative (01/03 1140)   TDaP vaccine    09/29/2017 Flu Shot: declines HIV: Non Reactive (01/03 1140)   Baby Food  Bottle                              GBS:  Hx of infant with GBS pneumonia, treat patient in labor  Contraception Plans BTL-papers signed 4/18 Pap: NIL 2019  CBB   Given information   CS/VBAC NA   Support Person John          Maternal Medical History:   Past Medical History:  Diagnosis Date  . 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 TMJ (dislocation of temporomandibular joint)     Past Surgical History:  Procedure Laterality Date  . TONSILLECTOMY      No Known Allergies  Prior to Admission  medications   Medication Sig Start Date End Date Taking? Authorizing Provider  pantoprazole (PROTONIX) 40 MG tablet Take 1 tablet (40 mg total) by mouth daily. 09/29/17  Yes Conard NovakJackson, Stephen D, MD  Prenatal Vit-Fe Fumarate-FA (MULTIVITAMIN-PRENATAL) 27-0.8 MG TABS tablet Take 1 tablet by mouth daily at 12 noon. 09/29/17  Yes Conard NovakJackson, Stephen D, MD  triamcinolone cream (KENALOG) 0.1 % Apply 1 application topically 2 (two) times daily. 11/25/17  Yes Oswaldo ConroySchmid, Jacelyn Y, CNM  diphenhydrAMINE (BENADRYL) 50 MG tablet Take 0.5 tablets (25 mg total) by mouth every 6 (six) hours as needed. Patient not taking: Reported on 11/29/2017 11/25/17   Oswaldo ConroySchmid, Jacelyn Y, CNM  Menthol (CEPACOL SORE THROAT) 5.4 MG LOZG Use as directed 1 lozenge (5.4 mg total) in the mouth or throat 4 (four) times daily. Patient not taking: Reported on 11/29/2017 11/25/17   Oswaldo ConroySchmid, Jacelyn Y, CNM               Social History: She  reports that she quit smoking about 4 months ago. Her smoking use included cigarettes. She has a 62.00 pack-year smoking history. She has never used smokeless tobacco. She reports that she does not drink alcohol or use drugs.  OB History  Gravida Para Term Preterm AB Living  5 4 4  0 4  SAB TAB Ectopic Multiple Live Births  0       4    # Outcome Date GA Lbr Len/2nd Weight Sex Delivery Anes PTL Lv  5 Current           4 Term 11/28/16 108w0d  3.402 kg (7 lb 8 oz) M Vag-Spont EPI  LIV  3 Term 11/10/15 [redacted]w[redacted]d  3.402 kg (7 lb 8 oz) M Vag-Spont   LIV  2 Term 04/11/96 [redacted]w[redacted]d  2.722 kg (6 lb) M Vag-Spont   LIV  1 Term 12/02/94 [redacted]w[redacted]d  2.722 kg (6 lb) M Vag-Spont   LIV     Birth Comments: baby had GBS pneumonia   Family History: family history includes Hypertension in her mother.   Review of Systems: Negative x 10 systems reviewed except as noted in the HPI. Also complains of localized pain in right chest wall in intercostal space near sternum    Physical Exam:  Vital Signs: BP 124/76 (BP Location: Right Arm)    Pulse 71   Temp 97.7 F (36.5 C) (Oral)   Resp 18   Ht 5\' 6"  (1.676 m)   Wt 96.6 kg (213 lb)   LMP 02/28/2017 (Exact Date)   SpO2 100%   BMI 34.38 kg/m  General: White gravid female in no acute distress.  HEENT: normocephalic, atraumatic Heart: regular rate & rhythm.  No murmurs/rubs/gallops Lungs: clear to auscultation bilaterally  Localized area of chest wall pain-not TTP Abdomen: soft, gravid, non-tender;  EFW: 8# Pelvic:   External: Normal external female genitalia  Cervix:3/60%/-1 to 0   Extremities: non-tender, symmetric, +1 edema bilaterally.  DTRs: +2  Neurologic: Alert & oriented x 3.    Baseline FHR: 135 with accelerations to 150s-160s, moderate variability Toco: occasional contraction   Assessment:  Karina Franco is a 42 y.o. G63P4004 female at [redacted]w[redacted]d for IOL FWB: Cat 1 Desires postpartum BTL-papers signed Hx of first baby with neonatal GBS pneumonia  Plan:  1. Admit to Labor & Delivery - notify attending  2. CBC, T&S, Clrs, IVF 3. PCN PPX due to hx of previous child with GBS pneumonia.   4. Consents obtained. 5. Plan treating with PCN, starting Pitocin and then probable AROM after receiving second dose of PCN. Discussed IOL process and risks of fetal intolerance to labor/ Cesarean section.  Farrel Conners  11/29/2017 1:19 PM

## 2017-11-29 NOTE — Anesthesia Preprocedure Evaluation (Signed)
Anesthesia Evaluation  Patient identified by MRN, date of birth, ID band Patient awake    Reviewed: Allergy & Precautions, NPO status , Patient's Chart, lab work & pertinent test results, reviewed documented beta blocker date and time   Airway Mallampati: III  TM Distance: >3 FB     Dental  (+) Chipped   Pulmonary former smoker,           Cardiovascular      Neuro/Psych    GI/Hepatic GERD  Controlled,  Endo/Other    Renal/GU      Musculoskeletal   Abdominal   Peds  Hematology   Anesthesia Other Findings Obese.  Reproductive/Obstetrics                             Anesthesia Physical Anesthesia Plan  ASA: II  Anesthesia Plan: Epidural   Post-op Pain Management:    Induction:   PONV Risk Score and Plan:   Airway Management Planned:   Additional Equipment:   Intra-op Plan:   Post-operative Plan:   Informed Consent: I have reviewed the patients History and Physical, chart, labs and discussed the procedure including the risks, benefits and alternatives for the proposed anesthesia with the patient or authorized representative who has indicated his/her understanding and acceptance.     Plan Discussed with: CRNA  Anesthesia Plan Comments:         Anesthesia Quick Evaluation

## 2017-11-29 NOTE — Progress Notes (Signed)
   Subjective:  Doing well no concerns, starting to feel contractions more  Objective:   Vitals: Blood pressure 127/77, pulse 65, temperature 97.8 F (36.6 C), temperature source Oral, resp. rate 18, height 5\' 6"  (1.676 m), weight 213 lb (96.6 kg), last menstrual period 02/28/2017, SpO2 100 %. 48 lb (21.8 kg)  General:  Abdomen: Cervical Exam:  Dilation: 4.5 Effacement (%): 70, 80 Cervical Position: Posterior Station: -2 Presentation: Vertex Exam by:: Dr Bonney AidStaebler  FHT: 140, moderate, +accles, no decels Toco: q615min  Results for orders placed or performed during the hospital encounter of 11/29/17 (from the past 24 hour(s))  CBC     Status: Abnormal   Collection Time: 11/29/17 12:27 PM  Result Value Ref Range   WBC 7.2 3.6 - 11.0 K/uL   RBC 3.46 (L) 3.80 - 5.20 MIL/uL   Hemoglobin 11.1 (L) 12.0 - 16.0 g/dL   HCT 16.131.7 (L) 09.635.0 - 04.547.0 %   MCV 91.6 80.0 - 100.0 fL   MCH 32.1 26.0 - 34.0 pg   MCHC 35.0 32.0 - 36.0 g/dL   RDW 40.913.7 81.111.5 - 91.414.5 %   Platelets 147 (L) 150 - 440 K/uL  Type and screen [facility]     Status: None   Collection Time: 11/29/17 12:27 PM  Result Value Ref Range   ABO/RH(D) A POS    Antibody Screen NEG    Sample Expiration      12/02/2017 Performed at [facility], [address] [place], [place] [address]     Assessment:   42 y.o. G5P4004 752w1d IOL AMA  Plan:   1) Labor - AROM clear, continue pitocin - grand multiparity increased risk of PPH  2) Fetus - cat I tracing - GBS ppx secondary to history of prior infant with GBS - 3-hr 88/179/157/33 - pelvis tested to 7lbs 8oz  Vena AustriaAndreas Jessicaann Overbaugh, MD, Merlinda FrederickFACOG [facility], [facility] 11/29/2017, 10:20 PM

## 2017-11-29 NOTE — Plan of Care (Signed)
1945 - Pt anxious about getting to point of delivery. Discussed with pt plan to start Pitocin, pt made aware of need for SVE and explained what to expect with medication. Pt treated with antibiotics for GBS pos x2 dose at this time. Pt verbalized understanding with plan.

## 2017-11-29 NOTE — Anesthesia Procedure Notes (Signed)
Epidural Patient location during procedure: OB  Staffing Anesthesiologist: Keifer Habib, MD Performed: anesthesiologist   Preanesthetic Checklist Completed: patient identified, site marked, surgical consent, pre-op evaluation, timeout performed, IV checked, risks and benefits discussed and monitors and equipment checked  Epidural Patient position: sitting Prep: ChloraPrep Patient monitoring: heart rate, continuous pulse ox and blood pressure Approach: midline Location: L4-L5 Injection technique: LOR saline  Needle:  Needle type: Tuohy  Needle gauge: 18 G Needle length: 9 cm and 9 Catheter type: closed end flexible Catheter size: 20 Guage Test dose: negative and 1.5% lidocaine with Epi 1:200 K  Assessment Sensory level: T10 Events: blood not aspirated, injection not painful, no injection resistance, negative IV test and no paresthesia  Additional Notes   Patient tolerated the insertion well without complications.Reason for block:procedure for pain     

## 2017-11-30 LAB — CBC
HEMATOCRIT: 30.8 % — AB (ref 35.0–47.0)
Hemoglobin: 10.8 g/dL — ABNORMAL LOW (ref 12.0–16.0)
MCH: 32.1 pg (ref 26.0–34.0)
MCHC: 35.1 g/dL (ref 32.0–36.0)
MCV: 91.4 fL (ref 80.0–100.0)
Platelets: 140 10*3/uL — ABNORMAL LOW (ref 150–440)
RBC: 3.37 MIL/uL — AB (ref 3.80–5.20)
RDW: 13.6 % (ref 11.5–14.5)
WBC: 9.3 10*3/uL (ref 3.6–11.0)

## 2017-11-30 LAB — RPR: RPR: NONREACTIVE

## 2017-11-30 MED ORDER — OXYCODONE-ACETAMINOPHEN 5-325 MG PO TABS
1.0000 | ORAL_TABLET | ORAL | Status: DC | PRN
  Administered 2017-12-01: 1 via ORAL
  Filled 2017-11-30: qty 1

## 2017-11-30 MED ORDER — PRENATAL MULTIVITAMIN CH
1.0000 | ORAL_TABLET | Freq: Every day | ORAL | Status: DC
  Administered 2017-11-30 – 2017-12-02 (×3): 1 via ORAL
  Filled 2017-11-30 (×3): qty 1

## 2017-11-30 MED ORDER — ONDANSETRON HCL 4 MG/2ML IJ SOLN: 4.0000 mg | INTRAMUSCULAR | Status: DC | PRN

## 2017-11-30 MED ORDER — BENZOCAINE-MENTHOL 20-0.5 % EX AERO
1.0000 "application " | INHALATION_SPRAY | CUTANEOUS | Status: DC | PRN
  Administered 2017-11-30: 1 via TOPICAL
  Filled 2017-11-30: qty 56

## 2017-11-30 MED ORDER — COCONUT OIL OIL: 1.0000 "application " | TOPICAL_OIL | Status: DC | PRN

## 2017-11-30 MED ORDER — WITCH HAZEL-GLYCERIN EX PADS: 1.0000 "application " | MEDICATED_PAD | CUTANEOUS | Status: DC | PRN

## 2017-11-30 MED ORDER — OXYCODONE-ACETAMINOPHEN 5-325 MG PO TABS
2.0000 | ORAL_TABLET | ORAL | Status: DC | PRN
  Filled 2017-11-30: qty 2

## 2017-11-30 MED ORDER — DIPHENHYDRAMINE HCL 25 MG PO CAPS: 25.0000 mg | ORAL_CAPSULE | Freq: Four times a day (QID) | ORAL | Status: DC | PRN

## 2017-11-30 MED ORDER — ONDANSETRON HCL 4 MG PO TABS: 4.0000 mg | ORAL_TABLET | ORAL | Status: DC | PRN

## 2017-11-30 MED ORDER — SENNOSIDES-DOCUSATE SODIUM 8.6-50 MG PO TABS
2.0000 | ORAL_TABLET | ORAL | Status: DC
  Administered 2017-12-02: 2 via ORAL
  Filled 2017-11-30 (×4): qty 2

## 2017-11-30 MED ORDER — SODIUM CHLORIDE 0.9% FLUSH
3.0000 mL | Freq: Two times a day (BID) | INTRAVENOUS | Status: DC
  Administered 2017-11-30 – 2017-12-02 (×2): 3 mL via INTRAVENOUS

## 2017-11-30 MED ORDER — SIMETHICONE 80 MG PO CHEW
80.0000 mg | CHEWABLE_TABLET | ORAL | Status: DC | PRN
  Administered 2017-12-02: 80 mg via ORAL
  Filled 2017-11-30: qty 1

## 2017-11-30 MED ORDER — ACETAMINOPHEN 325 MG PO TABS
650.0000 mg | ORAL_TABLET | ORAL | Status: DC | PRN
  Administered 2017-11-30 – 2017-12-02 (×6): 650 mg via ORAL
  Filled 2017-11-30 (×7): qty 2

## 2017-11-30 MED ORDER — SODIUM CHLORIDE 0.9% FLUSH
3.0000 mL | INTRAVENOUS | Status: DC | PRN
  Administered 2017-12-01: 3 mL via INTRAVENOUS
  Filled 2017-11-30: qty 3

## 2017-11-30 MED ORDER — DIBUCAINE 1 % RE OINT: 1.0000 "application " | TOPICAL_OINTMENT | RECTAL | Status: DC | PRN

## 2017-11-30 MED ORDER — IBUPROFEN 600 MG PO TABS
600.0000 mg | ORAL_TABLET | Freq: Four times a day (QID) | ORAL | Status: DC
  Administered 2017-11-30: 600 mg via ORAL
  Filled 2017-11-30: qty 1

## 2017-11-30 MED ORDER — SODIUM CHLORIDE 0.9 % IV SOLN: 250.0000 mL | INTRAVENOUS | Status: DC | PRN

## 2017-11-30 MED ORDER — IBUPROFEN 600 MG PO TABS
600.0000 mg | ORAL_TABLET | Freq: Four times a day (QID) | ORAL | Status: DC
  Administered 2017-11-30 – 2017-12-03 (×12): 600 mg via ORAL
  Filled 2017-11-30 (×12): qty 1

## 2017-11-30 NOTE — Discharge Summary (Addendum)
Obstetric Discharge Summary Reason for Admission: induction of labor Prenatal Procedures: none Intrapartum Procedures: spontaneous vaginal delivery Postpartum Procedures: P.P. tubal ligation, medicine consult, EKG, echocardiogram Complications-Operative and Postpartum: none Hemoglobin  Date Value Ref Range Status  12/02/2017 11.3 (L) 12.0 - 16.0 g/dL Final  16/10/960403/18/2019 54.011.2 11.1 - 15.9 g/dL Final   HCT  Date Value Ref Range Status  12/02/2017 32.2 (L) 35.0 - 47.0 % Final   Hematocrit  Date Value Ref Range Status  09/12/2017 34.4 34.0 - 46.6 % Final    Physical Exam:  General: alert, cooperative and no distress Lochia: appropriate Uterine Fundus: firm Incision: clean, dry, and intact  DVT Evaluation: No evidence of DVT seen on physical exam. Negative Homan's sign. No cords or calf tenderness. No significant calf/ankle edema.  [facility]  [facility] Course: admitted for induction of labor, had SVD without difficulty. On PPD#1 she underwent pp tubal ligation, which was uncomplicated. On PPD#2 she reported chest pain.  Medicine consult obtained. Negative initial cardiac workup. ECHO pending at discharge. However, troponins and EKG negative. General Medicine does not believe this is cardiac in origin and patient has been asymptomatic since initial complaint greater than 24 hours prior to discharge. On PPD#3, she is ambulating, tolerating PO, pain is well controlled on minimal pain medication, she is voiding and passing flatus.  She is meeting discharge criteria with stable vital signs and no acute complaints.   Discharge Information: Date: 12/03/2017 Activity: pelvic rest Diet: routine Allergies as of 12/03/2017   No Known Allergies     Medication List    STOP taking these medications   diphenhydrAMINE 50 MG tablet Commonly known as:  BENADRYL   Menthol 5.4 MG Lozg Commonly known as:  CEPACOL SORE THROAT   nitrofurantoin  (macrocrystal-monohydrate) 100 MG capsule Commonly known as:  MACROBID     TAKE these medications   multivitamin-prenatal 27-0.8 MG Tabs tablet Take 1 tablet by mouth daily at 12 noon.   pantoprazole 40 MG tablet Commonly known as:  PROTONIX Take 1 tablet (40 mg total) by mouth daily.   triamcinolone cream 0.1 % Commonly known as:  KENALOG Apply 1 application topically 2 (two) times daily.            Discharge Care Instructions  (From admission, onward)        Start     Ordered   12/03/17 0000  Discharge wound care:    Comments:  Perform wound care instructions   12/03/17 1126      Condition: stable Discharge to: home Follow-up Information    Vena AustriaStaebler, Andreas, MD Follow up in 6 week(s).   Specialty:  Obstetrics and Gynecology Contact information: [address] [place] [address] [phone]        [facility]. Schedule an appointment as soon as possible for a visit in 1 week(s).   Specialty:  Cardiology Why:  cardiac follow up Contact information: [address] [id number] [place] [id number] [phone]          Newborn Data: Live born child  Birth Weight:   APGAR: ,   Newborn Delivery   Birth date/time:  11/30/2017 01:24:00 Delivery type:  Vaginal, Spontaneous     Home with mother.  Thomasene MohairStephen Jahkari Maclin, MD 12/03/2017, 11:41 AM

## 2017-11-30 NOTE — Plan of Care (Signed)
Delivery of female infant at 0124, perineum remains intact, moderate lochia, few clots noted. Pitocin bolus then 62.5cc/hr. Pt reports minimal to no discomfort, mainly pressure with fundal massage. Hemorrhoid noted. Ice pack on perineum.Pt provided with sandwich tray. Tolerating po well. Plans to bottlefeed. Will have BTL scheduled for today or Thurs per Dr Bonney AidStaebler.

## 2017-11-30 NOTE — Progress Notes (Signed)
PPD#0 SVD Subjective:  Holding baby, well-appearing. Pain control is adequate with PRN medication. Voiding without difficulty. Tolerating a regular diet. Ambulating well.  Objective:   Blood pressure 123/84, pulse 67, temperature 97.6 F (36.4 C), temperature source Oral, resp. rate 20, height 5\' 6"  (1.676 m), weight 213 lb (96.6 kg), last menstrual period 02/28/2017, SpO2 97 %, unknown if currently breastfeeding.  General: NAD Pulmonary: no increased work of breathing Abdomen: non-distended, non-tender Uterus:  fundus firm, lochia appropriate Laceration: N/A Extremities: no edema, no erythema, no tenderness, no signs of DVT  Results for orders placed or performed during the hospital encounter of 11/29/17 (from the past 72 hour(s))  CBC     Status: Abnormal   Collection Time: 11/29/17 12:27 PM  Result Value Ref Range   WBC 7.2 3.6 - 11.0 K/uL   RBC 3.46 (L) 3.80 - 5.20 MIL/uL   Hemoglobin 11.1 (L) 12.0 - 16.0 g/dL   HCT 09.831.7 (L) 11.935.0 - 14.747.0 %   MCV 91.6 80.0 - 100.0 fL   MCH 32.1 26.0 - 34.0 pg   MCHC 35.0 32.0 - 36.0 g/dL   RDW 82.913.7 56.211.5 - 13.014.5 %   Platelets 147 (L) 150 - 440 K/uL    Comment: Performed at [facility], [address] [place], [place] [address]  Type and screen [facility]     Status: None   Collection Time: 11/29/17 12:27 PM  Result Value Ref Range   ABO/RH(D) A POS    Antibody Screen NEG    Sample Expiration      12/02/2017 Performed at [facility], [address] [place], [place] [address]   RPR     Status: None   Collection Time: 11/29/17 12:27 PM  Result Value Ref Range   RPR Ser Ql Non Reactive Non Reactive    Comment: (NOTE) Performed At: [facility] [address] [place], [place] [id number] Jolene SchimkeNagendra Sanjai MD [phone] Performed at [facility], [address] [place], [place] [address]   CBC     Status: Abnormal   Collection Time: 11/30/17  5:23 AM  Result Value Ref Range    WBC 9.3 3.6 - 11.0 K/uL   RBC 3.37 (L) 3.80 - 5.20 MIL/uL   Hemoglobin 10.8 (L) 12.0 - 16.0 g/dL   HCT 42.530.8 (L) 95.635.0 - 38.747.0 %   MCV 91.4 80.0 - 100.0 fL   MCH 32.1 26.0 - 34.0 pg   MCHC 35.1 32.0 - 36.0 g/dL   RDW 56.413.6 33.211.5 - 95.114.5 %   Platelets 140 (L) 150 - 440 K/uL    Comment: Performed at [facility], [address] [place], [place] [address]    Assessment:   42 y.o. [id number] postpartum day #0 recovering well.  Plan:   1) Acute blood loss anemia - hemodynamically stable and asymptomatic  2) Blood Type --/--/A POS (06/04 1227) /   3) Rubella 2.24 (01/03 1140) / Varicella Immune / TDAP status: given 09/29/2017  4) Formula feeding  5) Contraception: BTL  6) Disposition: continue postpartum care.  Marcelyn BruinsJacelyn Joyanna Kleman, CNM 11/30/2017  10:22 AM

## 2017-12-01 ENCOUNTER — Inpatient Hospital Stay: Payer: Medicaid Other | Admitting: Anesthesiology

## 2017-12-01 ENCOUNTER — Encounter
Admission: EM | Disposition: A | Discharge status: Home / Self Care | Payer: Self-pay | Source: Home / Self Care | Attending: Obstetrics and Gynecology

## 2017-12-01 ENCOUNTER — Other Ambulatory Visit: Payer: Self-pay

## 2017-12-01 DIAGNOSIS — Z302 Encounter for sterilization: Secondary | ICD-10-CM

## 2017-12-01 HISTORY — PX: TUBAL LIGATION: SHX77

## 2017-12-01 SURGERY — LIGATION, FALLOPIAN TUBE, POSTPARTUM
Anesthesia: General | Laterality: Bilateral

## 2017-12-01 MED ORDER — BUPIVACAINE-EPINEPHRINE (PF) 0.25% -1:200000 IJ SOLN
INTRAMUSCULAR | Status: DC | PRN
  Administered 2017-12-01: 4 mL
  Administered 2017-12-01: 10 mL

## 2017-12-01 MED ORDER — DEXAMETHASONE SODIUM PHOSPHATE 10 MG/ML IJ SOLN
INTRAMUSCULAR | Status: DC | PRN
  Administered 2017-12-01: 10 mg via INTRAVENOUS

## 2017-12-01 MED ORDER — BUPIVACAINE-EPINEPHRINE (PF) 0.25% -1:200000 IJ SOLN
INTRAMUSCULAR | Status: AC
  Filled 2017-12-01: qty 30

## 2017-12-01 MED ORDER — DEXAMETHASONE SODIUM PHOSPHATE 10 MG/ML IJ SOLN
INTRAMUSCULAR | Status: AC
Start: 2017-12-01 — End: ?
  Filled 2017-12-01: qty 1

## 2017-12-01 MED ORDER — ROCURONIUM BROMIDE 50 MG/5ML IV SOLN
INTRAVENOUS | Status: AC
  Filled 2017-12-01: qty 1

## 2017-12-01 MED ORDER — LIDOCAINE HCL (PF) 2 % IJ SOLN
INTRAMUSCULAR | Status: AC
  Filled 2017-12-01: qty 10

## 2017-12-01 MED ORDER — FENTANYL CITRATE (PF) 100 MCG/2ML IJ SOLN
INTRAMUSCULAR | Status: AC
  Filled 2017-12-01: qty 2

## 2017-12-01 MED ORDER — FENTANYL CITRATE (PF) 100 MCG/2ML IJ SOLN
INTRAMUSCULAR | Status: AC
  Administered 2017-12-01: 25 ug via INTRAVENOUS
  Filled 2017-12-01: qty 2

## 2017-12-01 MED ORDER — FENTANYL CITRATE (PF) 100 MCG/2ML IJ SOLN
INTRAMUSCULAR | Status: DC | PRN
  Administered 2017-12-01: 100 ug via INTRAVENOUS

## 2017-12-01 MED ORDER — LACTATED RINGERS IV SOLN
INTRAVENOUS | Status: DC | PRN
  Administered 2017-12-01: 14:00:00 via INTRAVENOUS

## 2017-12-01 MED ORDER — ONDANSETRON HCL 4 MG/2ML IJ SOLN: 4.0000 mg | Freq: Once | INTRAMUSCULAR | Status: DC | PRN

## 2017-12-01 MED ORDER — ACETAMINOPHEN 10 MG/ML IV SOLN
INTRAVENOUS | Status: AC
  Filled 2017-12-01: qty 100

## 2017-12-01 MED ORDER — SUGAMMADEX SODIUM 200 MG/2ML IV SOLN
INTRAVENOUS | Status: DC | PRN
  Administered 2017-12-01: 200 mg via INTRAVENOUS

## 2017-12-01 MED ORDER — LACTATED RINGERS IV SOLN: INTRAVENOUS | Status: DC

## 2017-12-01 MED ORDER — ONDANSETRON HCL 4 MG/2ML IJ SOLN
INTRAMUSCULAR | Status: DC | PRN
  Administered 2017-12-01: 4 mg via INTRAVENOUS

## 2017-12-01 MED ORDER — PROPOFOL 10 MG/ML IV BOLUS
INTRAVENOUS | Status: DC | PRN
  Administered 2017-12-01: 150 mg via INTRAVENOUS

## 2017-12-01 MED ORDER — SODIUM CHLORIDE 0.9 % IR SOLN
Status: DC | PRN
  Administered 2017-12-01: 60 mL

## 2017-12-01 MED ORDER — SUGAMMADEX SODIUM 200 MG/2ML IV SOLN
INTRAVENOUS | Status: AC
  Filled 2017-12-01: qty 2

## 2017-12-01 MED ORDER — ONDANSETRON HCL 4 MG/2ML IJ SOLN
INTRAMUSCULAR | Status: AC
  Filled 2017-12-01: qty 2

## 2017-12-01 MED ORDER — LIDOCAINE HCL (CARDIAC) PF 100 MG/5ML IV SOSY
PREFILLED_SYRINGE | INTRAVENOUS | Status: DC | PRN
  Administered 2017-12-01: 50 mg via INTRAVENOUS

## 2017-12-01 MED ORDER — PROPOFOL 10 MG/ML IV BOLUS
INTRAVENOUS | Status: AC
  Filled 2017-12-01: qty 20

## 2017-12-01 MED ORDER — FENTANYL CITRATE (PF) 100 MCG/2ML IJ SOLN
25.0000 ug | INTRAMUSCULAR | Status: DC | PRN
  Administered 2017-12-01 (×4): 25 ug via INTRAVENOUS

## 2017-12-01 MED ORDER — ACETAMINOPHEN 10 MG/ML IV SOLN
INTRAVENOUS | Status: DC | PRN
  Administered 2017-12-01: 1000 mg via INTRAVENOUS

## 2017-12-01 MED ORDER — ROCURONIUM BROMIDE 100 MG/10ML IV SOLN
INTRAVENOUS | Status: DC | PRN
  Administered 2017-12-01: 30 mg via INTRAVENOUS

## 2017-12-01 SURGICAL SUPPLY — 32 items
BLADE CLIPPER SURG (BLADE) ×3 IMPLANT
BLADE SURG SZ11 CARB STEEL (BLADE) ×3 IMPLANT
CANISTER SUCT 1200ML W/VALVE (MISCELLANEOUS) ×3 IMPLANT
CHLORAPREP W/TINT 26ML (MISCELLANEOUS) ×3 IMPLANT
DERMABOND ADVANCED (GAUZE/BANDAGES/DRESSINGS) ×2
DERMABOND ADVANCED .7 DNX12 (GAUZE/BANDAGES/DRESSINGS) ×1 IMPLANT
DRAPE LAPAROTOMY 77X122 PED (DRAPES) ×3 IMPLANT
ELECT CAUTERY BLADE 6.4 (BLADE) ×3 IMPLANT
ELECT REM PT RETURN 9FT ADLT (ELECTROSURGICAL) ×3
ELECTRODE REM PT RTRN 9FT ADLT (ELECTROSURGICAL) ×1 IMPLANT
GLOVE BIO SURGEON STRL SZ7 (GLOVE) ×9 IMPLANT
GLOVE BIOGEL PI IND STRL 7.0 (GLOVE) ×1 IMPLANT
GLOVE BIOGEL PI IND STRL 7.5 (GLOVE) ×1 IMPLANT
GLOVE BIOGEL PI INDICATOR 7.0 (GLOVE) ×2
GLOVE BIOGEL PI INDICATOR 7.5 (GLOVE) ×2
GOWN STRL REUS W/ TWL LRG LVL3 (GOWN DISPOSABLE) ×3 IMPLANT
GOWN STRL REUS W/ TWL XL LVL3 (GOWN DISPOSABLE) IMPLANT
GOWN STRL REUS W/TWL LRG LVL3 (GOWN DISPOSABLE) ×6
GOWN STRL REUS W/TWL XL LVL3 (GOWN DISPOSABLE)
KIT TURNOVER CYSTO (KITS) ×3 IMPLANT
LABEL OR SOLS (LABEL) ×3 IMPLANT
NEEDLE HYPO 22GX1.5 SAFETY (NEEDLE) ×3 IMPLANT
NS IRRIG 500ML POUR BTL (IV SOLUTION) ×3 IMPLANT
PACK BASIN MINOR ARMC (MISCELLANEOUS) ×3 IMPLANT
SUT MNCRL 4-0 (SUTURE) ×2
SUT MNCRL 4-0 27XMFL (SUTURE) ×1
SUT PLAIN GUT 0 (SUTURE) ×6 IMPLANT
SUT VIC AB 2-0 UR6 27 (SUTURE) ×3 IMPLANT
SUT VIC AB 3-0 SH 27 (SUTURE) ×2
SUT VIC AB 3-0 SH 27X BRD (SUTURE) ×1 IMPLANT
SUTURE MNCRL 4-0 27XMF (SUTURE) ×1 IMPLANT
SYR 10ML LL (SYRINGE) ×3 IMPLANT

## 2017-12-01 NOTE — Anesthesia Procedure Notes (Signed)
Procedure Name: Intubation Date/Time: 12/01/2017 2:23 PM Performed by: Jonna Clark, CRNA Pre-anesthesia Checklist: Patient identified, Patient being monitored, Timeout performed, Emergency Drugs available and Suction available Patient Re-evaluated:Patient Re-evaluated prior to induction Oxygen Delivery Method: Circle system utilized Preoxygenation: Pre-oxygenation with 100% oxygen Induction Type: IV induction Ventilation: Mask ventilation without difficulty Laryngoscope Size: Mac and 3 Grade View: Grade III Tube type: Oral Tube size: 7.0 mm Number of attempts: 1 Airway Equipment and Method: Stylet Placement Confirmation: ETT inserted through vocal cords under direct vision,  positive ETCO2 and breath sounds checked- equal and bilateral Secured at: 21 cm Tube secured with: Tape Dental Injury: Teeth and Oropharynx as per pre-operative assessment

## 2017-12-01 NOTE — Anesthesia Post-op Follow-up Note (Signed)
Anesthesia QCDR form completed.        

## 2017-12-01 NOTE — Op Note (Signed)
Operative Report  Pre-Op Diagnosis: multiparity, desires permanent sterilization  Post-Op Diagnosis: multiparity, desires permanent sterilization  Procedures:  Postpartum tubal ligation via Pomeroy method  Primary Surgeon: Thomasene MohairStephen Kennadee Walthour, MD  EBL: 20 ml   IVF: 400 mL   Specimens: portion of right and left fallopian tubes  Drains: None  Complications: None   Disposition: PACU   Condition: Stable   Findings: normal appearing bilateral fallopian tubes  Indication: The patient is a 42 y.o. [id number] who is postpartum day 1 status post spontaneous vaginal delivery.  She has been counseled extensively regarding risks, benefits, and alternatives to tubal ligation, including non-permanent forms of contraception that are equivalent in efficacy with potentially better side effects.  She has been advised that there is a failure rate of 3-5 in every 1,000 tubal ligations per year with an increased risk of ectopic pregnancy should pregnancy occur.   Procedure Summary:  The patient was taken to the operating room where general anesthesia was administered and found to be adequate. After timeout was called a small transverse, infraumbilical incision was made with the scalpel. The incision was carried down through the fascia until the peritoneum was identified and entered. The peritoneum was noted to be free of any adhesions and the incision was then extended.  The patient's left fallopian tube was identified, brought incision, and grasped with a Babcock clamp. The tube was then followed out to the fimbria. The Babcock clamp was then used to grasp the tube approximately 4 cm from the cornual region. A 3 cm segment of tube was then ligated with the 2 free ties of plain gut, and excised. Good hemostasis was noted and the tube was returned to the abdomen. The right fallopian tube was then ligated, and a 3 cm segment excised in a similar fashion. Excellent hemostasis was noted, and the tube returned to  the abdomen.  The peritoneum and fascia were closed in a single layer using 2-0 Vicryl. A 3-0 Vicryl suture was used to reapproximate the subcutaneous tissue to reduce skin closure.  The skin was closed in a subcuticular fashion using 4-0 monocryl. The closure was also closed with Dermabond.  Sponge, lap, needle, and instrument counts were correct x 2.  VTE prophylaxis: SCDs. Antibiotic prophylaxis: none indicated. The patient tolerated the procedure well and was taken to the PACU in stable condition.   Thomasene MohairStephen Bryce Kimble, MD 12/01/2017 3:26 PM

## 2017-12-01 NOTE — Progress Notes (Signed)
42 y.o. [id number]  with undesired fertility, desires permanent sterilization.  Other reversible forms of contraception were discussed with patient; she declines all other modalities. Permanent nature of as well as associated risks of the procedure discussed with patient including but not limited to: risk of regret, permanence of method, bleeding, infection, injury to surrounding organs and need for additional procedures.  Failure risk of 0.5-1% with increased risk of ectopic gestation if pregnancy occurs was also discussed with patient.  She wishes to proceed. See daily progress note for particulars. Patient examined by me and I agree with the documented progress note.  Thomasene MohairStephen Erron Wengert, MD, Merlinda FrederickFACOG [facility], [facility] 12/01/2017 10:04 AM  i

## 2017-12-01 NOTE — Progress Notes (Signed)
Post Partum Day 1 Subjective: Doing well, no complaints.  Tolerating regular diet, pain with PO meds, voiding and ambulating without difficulty. Currently NPO awaiting tubal surgery and wishing she could have a drink of water.   No CP SOB F/C N/V or leg pain No HA, change of vision, RUQ/epigastric pain  Objective: BP 123/86 (BP Location: Left Arm)   Pulse (!) 57   Temp 98.6 F (37 C) (Oral)   Resp 18   Ht 5\' 6"  (1.676 m)   Wt 213 lb (96.6 kg)   LMP 02/28/2017 (Exact Date)   SpO2 98%   BMI 34.38 kg/m    Physical Exam:  General: NAD CV: RRR Pulm: nl effort, CTABL Lochia: moderate Uterine Fundus: fundus firm and below umbilicus DVT Evaluation: no cords, ttp LEs   Recent Labs    11/29/17 1227 11/30/17 0523  HGB 11.1* 10.8*  HCT 31.7* 30.8*  WBC 7.2 9.3  PLT 147* 140*    Assessment/Plan: 42 y.o. [id number] postpartum day # 1  1. Continue routine postpartum care 2. NPO currently for BTL this afternoon 3. A+, Rubella Immune, Varicella Immune 4. TDAP given antepartum 5. Formula feeding/Contraception: tubal ligation 6. Disposition: discharge to home Day 2   Tresea MallJane Sharni Negron, CNM

## 2017-12-01 NOTE — OR Nursing (Signed)
Patient reports to Dr Henrene HawkingKephart that she is having some chest pain and some SOB.  Patient reports experiencing this often.  Also, today is the anniversary of the death of one of her children and she reports being nervous about surgery.  Vital signs retaken and patient placed on bedside monitor for Dr Henrene HawkingKephart to review ekg.  Dr Henrene HawkingKephart reviewed ekg at bedside may proceed with surgery per Dr Henrene HawkingKephart

## 2017-12-01 NOTE — Anesthesia Preprocedure Evaluation (Addendum)
Anesthesia Evaluation  Patient identified by MRN, date of birth, ID band Patient awake    Reviewed: Allergy & Precautions, NPO status , Patient's Chart, lab work & pertinent test results  History of Anesthesia Complications Negative for: history of anesthetic complications  Airway Mallampati: II       Dental   Pulmonary neg sleep apnea, neg COPD, former smoker,           Cardiovascular (-) hypertension(-) Past MI and (-) CHF + dysrhythmias (sinus arrhythmia) (-) Valvular Problems/Murmurs     Neuro/Psych neg Seizures    GI/Hepatic Neg liver ROS, GERD  Medicated and Controlled,  Endo/Other  neg diabetes  Renal/GU negative Renal ROS     Musculoskeletal   Abdominal   Peds  Hematology   Anesthesia Other Findings   Reproductive/Obstetrics                           Anesthesia Physical Anesthesia Plan  ASA: II  Anesthesia Plan: General   Post-op Pain Management:    Induction: Intravenous  PONV Risk Score and Plan: 3 and Dexamethasone, Ondansetron and Midazolam  Airway Management Planned: Oral ETT  Additional Equipment:   Intra-op Plan:   Post-operative Plan:   Informed Consent: I have reviewed the patients History and Physical, chart, labs and discussed the procedure including the risks, benefits and alternatives for the proposed anesthesia with the patient or authorized representative who has indicated his/her understanding and acceptance.     Plan Discussed with:   Anesthesia Plan Comments:         Anesthesia Quick Evaluation

## 2017-12-01 NOTE — Transfer of Care (Signed)
Immediate Anesthesia Transfer of Care Note  Patient: Karina PenningRachelle Rials  Procedure(s) Performed: POST PARTUM TUBAL LIGATION (Bilateral )  Patient Location: PACU  Anesthesia Type:General  Level of Consciousness: awake, alert  and oriented  Airway & Oxygen Therapy: Patient Spontanous Breathing and Patient connected to face mask oxygen  Post-op Assessment: Report given to RN and Post -op Vital signs reviewed and stable  Post vital signs: Reviewed and stable  Last Vitals:  Vitals Value Taken Time  BP 92/68 12/01/2017  3:28 PM  Temp 36.3 C 12/01/2017  3:28 PM  Pulse 90 12/01/2017  3:32 PM  Resp 18 12/01/2017  3:32 PM  SpO2 98 % 12/01/2017  3:32 PM  Vitals shown include unvalidated device data.  Last Pain:  Vitals:   12/01/17 1528  TempSrc:   PainSc: Asleep         Complications: No apparent anesthesia complications

## 2017-12-01 NOTE — Plan of Care (Signed)
Vs stable; up ad lib; taking PO motrin and tylenol for pain control; NPO right now for BTL today; consent signed and [facility] "[facility]); feeding baby formula

## 2017-12-01 NOTE — Anesthesia Postprocedure Evaluation (Signed)
Anesthesia Post Note  Patient: Dewain PenningRachelle Hartel  Procedure(s) Performed: POST PARTUM TUBAL LIGATION (Bilateral )  Patient location during evaluation: PACU Anesthesia Type: General Level of consciousness: awake and alert Pain management: pain level controlled Vital Signs Assessment: post-procedure vital signs reviewed and stable Respiratory status: spontaneous breathing, nonlabored ventilation, respiratory function stable and patient connected to nasal cannula oxygen Cardiovascular status: blood pressure returned to baseline and stable Postop Assessment: no apparent nausea or vomiting Anesthetic complications: no     Last Vitals:  Vitals:   12/01/17 1558 12/01/17 1610  BP: 130/77 114/69  Pulse: 62 68  Resp: 12 14  Temp:  36.8 C  SpO2: 95% 93%    Last Pain:  Vitals:   12/01/17 1610  TempSrc:   PainSc: Asleep                 Cleda MccreedyJoseph K Piscitello

## 2017-12-02 ENCOUNTER — Encounter: Payer: Self-pay | Admitting: Obstetrics and Gynecology

## 2017-12-02 ENCOUNTER — Inpatient Hospital Stay: Payer: Medicaid Other

## 2017-12-02 ENCOUNTER — Inpatient Hospital Stay (HOSPITAL_COMMUNITY)
Admit: 2017-12-02 | Discharge: 2017-12-02 | Disposition: A | Discharge status: Home / Self Care | Payer: Medicaid Other | Attending: Internal Medicine | Admitting: Internal Medicine

## 2017-12-02 DIAGNOSIS — R079 Chest pain, unspecified: Secondary | ICD-10-CM

## 2017-12-02 DIAGNOSIS — I340 Nonrheumatic mitral (valve) insufficiency: Secondary | ICD-10-CM

## 2017-12-02 LAB — CBC
HCT: 32.2 % — ABNORMAL LOW (ref 35.0–47.0)
Hemoglobin: 11.3 g/dL — ABNORMAL LOW (ref 12.0–16.0)
MCH: 32.3 pg (ref 26.0–34.0)
MCHC: 35.1 g/dL (ref 32.0–36.0)
MCV: 92.2 fL (ref 80.0–100.0)
PLATELETS: 201 10*3/uL (ref 150–440)
RBC: 3.49 MIL/uL — AB (ref 3.80–5.20)
RDW: 14 % (ref 11.5–14.5)
WBC: 10.6 10*3/uL (ref 3.6–11.0)

## 2017-12-02 LAB — COMPREHENSIVE METABOLIC PANEL
ALT: 14 U/L (ref 14–54)
AST: 26 U/L (ref 15–41)
Albumin: 2.8 g/dL — ABNORMAL LOW (ref 3.5–5.0)
Alkaline Phosphatase: 124 U/L (ref 38–126)
Anion gap: 8 (ref 5–15)
BUN: 9 mg/dL (ref 6–20)
CO2: 25 mmol/L (ref 22–32)
Calcium: 8.4 mg/dL — ABNORMAL LOW (ref 8.9–10.3)
Chloride: 103 mmol/L (ref 101–111)
Creatinine, Ser: 0.62 mg/dL (ref 0.44–1.00)
GFR calc Af Amer: >60 mL/min (ref >60)
GFR calc non Af Amer: >60 mL/min (ref >60)
Glucose, Bld: 123 mg/dL — ABNORMAL HIGH (ref 65–99)
Potassium: 3.6 mmol/L (ref 3.5–5.1)
Sodium: 136 mmol/L (ref 135–145)
Total Bilirubin: 0.3 mg/dL (ref 0.3–1.2)
Total Protein: 6.2 g/dL — ABNORMAL LOW (ref 6.5–8.1)

## 2017-12-02 LAB — PROTEIN / CREATININE RATIO, URINE
Creatinine, Urine: 49 mg/dL
Protein Creatinine Ratio: 0.16 mg/mg{Cre} — ABNORMAL HIGH (ref 0.00–0.15)
Total Protein, Urine: 8 mg/dL

## 2017-12-02 LAB — TROPONIN I
Troponin I: <0.03 ng/mL (ref <0.03)
Troponin I: <0.03 ng/mL (ref <0.03)
Troponin I: <0.03 ng/mL (ref <0.03)

## 2017-12-02 MED ORDER — GI COCKTAIL ~~LOC~~
30.0000 mL | Freq: Once | ORAL | Status: AC
  Administered 2017-12-02: 30 mL via ORAL
  Filled 2017-12-02: qty 30

## 2017-12-02 MED ORDER — ASPIRIN 81 MG PO CHEW
81.0000 mg | CHEWABLE_TABLET | ORAL | Status: AC
  Administered 2017-12-02: 81 mg via ORAL
  Filled 2017-12-02 (×2): qty 1

## 2017-12-02 MED ORDER — FAMOTIDINE 20 MG PO TABS: 20.0000 mg | ORAL_TABLET | Freq: Every day | ORAL | Status: DC

## 2017-12-02 MED ORDER — MENTHOL 3 MG MT LOZG
1.0000 | LOZENGE | OROMUCOSAL | Status: DC | PRN
  Administered 2017-12-02: 3 mg via ORAL
  Filled 2017-12-02 (×2): qty 9

## 2017-12-02 MED ORDER — IPRATROPIUM-ALBUTEROL 0.5-2.5 (3) MG/3ML IN SOLN
3.0000 mL | RESPIRATORY_TRACT | Status: DC
  Administered 2017-12-02 – 2017-12-03 (×5): 3 mL via RESPIRATORY_TRACT
  Filled 2017-12-02 (×6): qty 3

## 2017-12-02 MED ORDER — FAMOTIDINE 20 MG PO TABS
20.0000 mg | ORAL_TABLET | Freq: Every day | ORAL | Status: DC
  Administered 2017-12-02 – 2017-12-03 (×2): 20 mg via ORAL
  Filled 2017-12-02 (×2): qty 1

## 2017-12-02 NOTE — Progress Notes (Signed)
Post Partum Day #2/ POD #1 BTL Subjective: Complains of substernal chest pain that began last night before midnight. Chest pain radiates to her back when she leans back and worsens when she takes a deep breath.  Also complains of a sore throat and has coughed up some off white sputum. Has had an URI recently. No nausea. Voiding without difficulty. Chest pain hurts worse then post op abdominal pain.  Objective: Blood pressure (!) 146/95, pulse 60, temperature 97.9 F (36.6 C), resp. rate 20, height 5\' 6"  (1.676 m), weight 96.6 kg (213 lb), last menstrual period 02/28/2017, SpO2 97 %, unknown if currently breastfeeding. Patient Vitals for the past 24 hrs:  BP Temp Temp src Pulse Resp SpO2  12/02/17 0829 (!) 146/95 97.9 F (36.6 C) - 60 - 97 %  12/02/17 0751 (!) 157/97 - - (!) 59 20 97 %  12/02/17 0710 (!) 174/94 (!) 97.1 F (36.2 C) - (!) 59 18 94 %  12/02/17 0100 (!) 151/82 97.9 F (36.6 C) Oral (!) 53 20 99 %  12/01/17 1958 129/82 98.1 F (36.7 C) Oral 60 20 -  12/01/17 1700 (!) 134/96 98 F (36.7 C) Oral 60 18 99 %  12/01/17 1642 (!) 143/88 97.7 F (36.5 C) - (!) 56 16 99 %  12/01/17 1610 114/69 98.2 F (36.8 C) - 68 14 93 %  12/01/17 1558 130/77 - - 62 12 95 %  12/01/17 1543 131/87 - - 73 14 99 %  12/01/17 1528 92/68 (!) 97.3 F (36.3 C) - 60 13 99 %  12/01/17 1340 135/85 - - 60 16 98 %  12/01/17 1322 (!) 141/85 97.7 F (36.5 C) Temporal (!) 56 18 98 %  12/01/17 1216 128/84 98 F (36.7 C) Oral 64 18 99 %  12/01/17 0918 123/86 98.6 F (37 C) Oral (!) 57 18 98 %   Physical Exam:  General: alert and cooperative , appears calm Heart: AP 60, regular rhythm, without murmur Lungs:  No increased respiratory effort, decreased breath sounds in the bases, otherwise CTA  Lochia: appropriate Umbilical Incision: C+D+I DVT Evaluation: No evidence of DVT seen on physical exam.  Recent Labs    11/29/17 1227 11/30/17 0523  HGB 11.1* 10.8*  HCT 31.7* 30.8*  WBC 7.2 9.3  PLT 147*  140*   EKG: sinus bradycardia with possible left atrial enlargement Assessment/Plan: PPD #2 and POD #1 with chest pain and elevated blood pressures -notified DR Jerene PitchSchuman  Triponin, CMP, CBC, PC ratio stat  Hospitalist consulted (Dr Allena KatzPatel)  Continuous pulse OX  Stat ASA 81 mgm    LOS: 3 days   Farrel Connersolleen Lyric Hoar 12/02/2017, 8:59 AM

## 2017-12-02 NOTE — Anesthesia Postprocedure Evaluation (Signed)
Anesthesia Post Note  Patient: Dewain PenningRachelle Bobak  Procedure(s) Performed: AN AD HOC LABOR EPIDURAL  Patient location during evaluation: Mother Baby Anesthesia Type: Epidural Level of consciousness: awake and alert and oriented Pain management: satisfactory to patient Vital Signs Assessment: post-procedure vital signs reviewed and stable Respiratory status: respiratory function stable Cardiovascular status: stable Postop Assessment: no backache, no headache, epidural receding, patient able to bend at knees, no apparent nausea or vomiting, able to ambulate and adequate PO intake Anesthetic complications: no     Last Vitals:  Vitals:   12/01/17 1958 12/02/17 0100  BP: 129/82 (!) 151/82  Pulse: 60 (!) 53  Resp: 20 20  Temp: 36.7 C 36.6 C  SpO2:  99%    Last Pain:  Vitals:   12/02/17 0356  TempSrc:   PainSc: 4                  Clydene PughBeane, Donivan Thammavong D

## 2017-12-02 NOTE — Progress Notes (Signed)
*  PRELIMINARY RESULTS* Echocardiogram 2D Echocardiogram has been performed.  Karina Franco S Karina Franco 12/02/2017, 8:07 PM 

## 2017-12-02 NOTE — Consult Note (Signed)
Medical Consultation  Karina Franco RUE:[id number] DOB: [birth year] DOA: 11/29/2017 PCP: Patient, No Pcp Per   Requesting physician: Farrel Conners Date of consultation: December 02, 2017 Reason for consultation: Chest pain  Impression/Recommendations  42 year old female with no past medical history postpartum day #2 who reports chest pain.  1.  Chest pain: This is atypical for cardiac etiology. Chest x-ray is negative for acute infection and CHF. Follow telemetry and troponins Order echocardiogram and duo nebs    Chief Complaint: Chest pain  HPI:  42 year old female postpartum day #2 who reports chest pain.  Patient reports upper chest pain which started last night and has been persistent throughout the night.  She reports it is worse when laying down.  She also reports a cough.  She reports no fever or chills.  She reports no pleuritic chest pain.  She reports no wheezing.  She quit smoking about 4 months ago.  She denies history of asthma or COPD. She reports heartburn when she was pregnant.  Review of Systems  Constitutional: Negative for fever, chills weight loss HENT: Negative for ear pain, nosebleeds, congestion, facial swelling, rhinorrhea, neck pain, neck stiffness and ear discharge.   Respiratory: Negative for cough, shortness of breath, wheezing  Cardiovascular: Positive for chest pain, no palpitations and leg swelling.  Gastrointestinal: Negative for heartburn, abdominal pain, vomiting, diarrhea or consitpation Genitourinary: Negative for dysuria, urgency, frequency, hematuria Musculoskeletal: Negative for back pain or joint pain Neurological: Negative for dizziness, seizures, syncope, focal weakness,  numbness and headaches.  Hematological: Does not bruise/bleed easily.  Psychiatric/Behavioral: Negative for hallucinations, confusion, dysphoric mood   Past Medical History:  Diagnosis Date  . Heart valve problem    Leaking 11/2016, causes random chest pain  . TMJ  (dislocation of temporomandibular joint)    Past Surgical History:  Procedure Laterality Date  . TONSILLECTOMY    . TUBAL LIGATION Bilateral 12/01/2017   Procedure: POST PARTUM TUBAL LIGATION;  Surgeon: Conard Novak, MD;  Location: [facility] ORS;  Service: Gynecology;  Laterality: Bilateral;   Social History:  reports that she quit smoking about 4 months ago. Her smoking use included cigarettes. She has a 62.00 pack-year smoking history. She has never used smokeless tobacco. She reports that she does not drink alcohol or use drugs.  No Known Allergies Family History  Problem Relation Age of Onset  . Hypertension Mother   . Heart disease Neg Hx   . Cancer Neg Hx   . Thyroid disease Neg Hx   . Diabetes Neg Hx     Prior to Admission medications   Medication Sig Start Date End Date Taking? Authorizing Provider  pantoprazole (PROTONIX) 40 MG tablet Take 1 tablet (40 mg total) by mouth daily. 09/29/17  Yes Conard Novak, MD  Prenatal Vit-Fe Fumarate-FA (MULTIVITAMIN-PRENATAL) 27-0.8 MG TABS tablet Take 1 tablet by mouth daily at 12 noon. 09/29/17  Yes Conard Novak, MD  triamcinolone cream (KENALOG) 0.1 % Apply 1 application topically 2 (two) times daily. 11/25/17  Yes Oswaldo Conroy, CNM  diphenhydrAMINE (BENADRYL) 50 MG tablet Take 0.5 tablets (25 mg total) by mouth every 6 (six) hours as needed. Patient not taking: Reported on 11/29/2017 11/25/17   Oswaldo Conroy, CNM  Menthol (CEPACOL SORE THROAT) 5.4 MG LOZG Use as directed 1 lozenge (5.4 mg total) in the mouth or throat 4 (four) times daily. Patient not taking: Reported on 11/29/2017 11/25/17   Oswaldo Conroy, CNM  nitrofurantoin, macrocrystal-monohydrate, (MACROBID) 100 MG capsule Take 1 capsule (  100 mg total) by mouth 2 (two) times daily. Patient not taking: Reported on 11/17/2017 11/07/17   Natale MilchSchuman, Christanna R, MD    Physical Exam: Blood pressure (!) 146/95, pulse 60, temperature 97.9 F (36.6 C), resp. rate 20, height 5'  6" (1.676 m), weight 96.6 kg (213 lb), last menstrual period 02/28/2017, SpO2 97 %, unknown if currently breastfeeding. @VITALS2 @ [employer]   11/29/17 1214  Weight: 96.6 kg (213 lb)    Intake/Output Summary (Last 24 hours) at 12/02/2017 1146 Last data filed at 12/02/2017 1039 Gross per 24 hour  Intake 620 ml  Output 20 ml  Net 600 ml     Constitutional: Appears well-developed and well-nourished. No distress. HENT: Normocephalic. Marland Kitchen. Oropharynx is clear and moist.  Eyes: Conjunctivae and EOM are normal. PERRLA, no scleral icterus.  Neck: Normal ROM. Neck supple. No JVD. No tracheal deviation. CVS: RRR, S1/S2 +, no murmurs, no gallops, no carotid bruit.  Pulmonary: Effort and breath sounds normal, no stridor, rhonchi, wheezes, rales.  Abdominal: Soft. BS +,  no distension, tenderness, rebound or guarding.  Musculoskeletal: Normal range of motion. No edema and no tenderness.  Neuro: Alert. CN 2-12 grossly intact. No focal deficits. Skin: Skin is warm and dry. No rash noted. Psychiatric: Normal mood and affect.    Labs  Basic Metabolic Panel: Recent Labs  Lab 12/02/17 0910  NA 136  K 3.6  CL 103  CO2 25  GLUCOSE 123*  BUN 9  CREATININE 0.62  CALCIUM 8.4*   Liver Function Tests: Recent Labs  Lab 12/02/17 0910  AST 26  ALT 14  ALKPHOS 124  BILITOT 0.3  PROT 6.2*  ALBUMIN 2.8*   No results for input(s): LIPASE, AMYLASE in the last 168 hours.  CBC: Recent Labs  Lab 12/02/17 0910  WBC 10.6  HGB 11.3*  HCT 32.2*  MCV 92.2  PLT 201   Cardiac Enzymes: Recent Labs  Lab 12/02/17 0910  TROPONINI <0.03   BNP: Invalid input(s): POCBNP CBG: No results for input(s): GLUCAP in the last 168 hours.  Radiological Exams: Dg Chest 1 View  Result Date: 12/02/2017 CLINICAL DATA:  Chest pain EXAM: CHEST  1 VIEW COMPARISON:  None. FINDINGS: Lungs are clear. Heart is upper normal in size with pulmonary vascularity within normal limits. No adenopathy. No evident bone  lesions. No pneumothorax. IMPRESSION: No edema or consolidation. Electronically Signed   By: Bretta BangWilliam  Woodruff III M.D.   On: 12/02/2017 09:47    EKG:  NSR no ST elevation   Thank you for allowing me to participate in the care of your patient. We will continue to follow.   Note: This dictation was prepared with Dragon dictation along with smaller phrase technology. Any transcriptional errors that result from this process are unintentional.  Time spent:  40 minutes  Sadia Belfiore, MD

## 2017-12-03 MED ORDER — IPRATROPIUM-ALBUTEROL 0.5-2.5 (3) MG/3ML IN SOLN: 3.0000 mL | RESPIRATORY_TRACT | Status: DC | PRN

## 2017-12-03 NOTE — Progress Notes (Signed)
[employer] - [place] at [facility]   PATIENT NAME: Karina PenningRachelle Franco    MR#:  [id number]  DATE OF BIRTH:  [birth year]  SUBJECTIVE:   no chest pain overnight  Better after she burped REVIEW OF SYSTEMS:    Review of Systems  Constitutional: Negative for fever, chills weight loss HENT: Negative for ear pain, nosebleeds, congestion, facial swelling, rhinorrhea, neck pain, neck stiffness and ear discharge.   Respiratory: Negative for cough, shortness of breath, wheezing  Cardiovascular: Negative for chest pain, palpitations and leg swelling.  Gastrointestinal: Negative for heartburn, abdominal pain, vomiting, diarrhea or consitpation Genitourinary: Negative for dysuria, urgency, frequency, hematuria Musculoskeletal: Negative for back pain or joint pain Neurological: Negative for dizziness, seizures, syncope, focal weakness,  numbness and headaches.  Hematological: Does not bruise/bleed easily.  Psychiatric/Behavioral: Negative for hallucinations, confusion, dysphoric mood    Tolerating Diet:yes      DRUG ALLERGIES:  No Known Allergies  VITALS:  Blood pressure 138/76, pulse 62, temperature 97.9 F (36.6 C), temperature source Oral, resp. rate 20, height 5\' 6"  (1.676 m), weight 96.6 kg (213 lb), last menstrual period 02/28/2017, SpO2 98 %, unknown if currently breastfeeding.  PHYSICAL EXAMINATION:  Constitutional: Appears well-developed and well-nourished. No distress. HENT: Normocephalic. Marland Kitchen. Oropharynx is clear and moist.  Eyes: Conjunctivae and EOM are normal. PERRLA, no scleral icterus.  Neck: Normal ROM. Neck supple. No JVD. No tracheal deviation. CVS: RRR, S1/S2 +, no murmurs, no gallops, no carotid bruit.  Pulmonary: Effort and breath sounds normal, no stridor, rhonchi, wheezes, rales.  Abdominal: Soft. BS +,  no distension, tenderness, rebound or guarding.  Musculoskeletal: Normal range of motion. No edema and no tenderness.  Neuro: Alert. CN 2-12  grossly intact. No focal deficits. Skin: Skin is warm and dry. No rash noted. Psychiatric: Normal mood and affect.      LABORATORY PANEL:   CBC Recent Labs  Lab 12/02/17 0910  WBC 10.6  HGB 11.3*  HCT 32.2*  PLT 201   ------------------------------------------------------------------------------------------------------------------  Chemistries  Recent Labs  Lab 12/02/17 0910  NA 136  K 3.6  CL 103  CO2 25  GLUCOSE 123*  BUN 9  CREATININE 0.62  CALCIUM 8.4*  AST 26  ALT 14  ALKPHOS 124  BILITOT 0.3   ------------------------------------------------------------------------------------------------------------------  Cardiac Enzymes Recent Labs  Lab 12/02/17 0910 12/02/17 1531 12/02/17 2121  TROPONINI <0.03 <0.03 <0.03   ------------------------------------------------------------------------------------------------------------------  RADIOLOGY:  Dg Chest 1 View  Result Date: 12/02/2017 CLINICAL DATA:  Chest pain EXAM: CHEST  1 VIEW COMPARISON:  None. FINDINGS: Lungs are clear. Heart is upper normal in size with pulmonary vascularity within normal limits. No adenopathy. No evident bone lesions. No pneumothorax. IMPRESSION: No edema or consolidation. Electronically Signed   By: Bretta BangWilliam  Woodruff III M.D.   On: 12/02/2017 09:47     ASSESSMENT AND PLAN:   42 year old female postpartum day #3 who had chest pain.  1.  Atypical chest pain: This is not cardiac in nature.  Troponins and EKG were essentially unremarkable.  Echocardiogram is pending.  I suggested patient have outpatient follow-up with cardiology in 1 week.  Please schedule appointment with [facility] cardiology for follow-up on echocardiogram.  The results of this should not delay discharge plan She has a history of a heart murmur which I do not hear on exam but can follow-up with cardiology in regards to this as well.   I will sign off.  Please call me if you have any further questions.  End  of care  discussed with nurse.  Management plans discussed with the patient and she is in agreement.  CODE STATUS: full  TOTAL TIME TAKING CARE OF THIS PATIENT: 20 minutes.     POSSIBLE D/C today, DEPENDING ON CLINICAL CONDITION.   Macel Yearsley M.D on 12/03/2017 at 9:43 AM  Between 7am to 6pm - Pager - [phone] After 6pm go to [email or web address] - password [email or web address]  [facility] [place] [facility]  Office  [phone]  CC: Primary care physician; Patient, No Pcp Per  Note: This dictation was prepared with Dragon dictation along with smaller phrase technology. Any transcriptional errors that result from this process are unintentional.

## 2017-12-03 NOTE — Progress Notes (Signed)
Patient treatments changed to Q4 prn. Per protocol severity score of 1, pt no longer requires Q4 treatments.

## 2017-12-03 NOTE — Discharge Instructions (Signed)
[facility] f[facility]u with [facility] in 1 wk.  Make appointment with ARIDA or Mariah MillingGOLLAN

## 2017-12-03 NOTE — Progress Notes (Signed)
Dc home to car via WC

## 2017-12-03 NOTE — Progress Notes (Signed)
Dc inst given to pt.  Verb u/o of f/u care at Cardiologist in 1 wk.

## 2017-12-04 LAB — ECHOCARDIOGRAM COMPLETE
Height: 66 in
Weight: 3408 oz

## 2017-12-05 LAB — SURGICAL PATHOLOGY

## 2018-03-31 ENCOUNTER — Ambulatory Visit (INDEPENDENT_AMBULATORY_CARE_PROVIDER_SITE_OTHER): Payer: Medicaid Other | Admitting: Obstetrics and Gynecology

## 2018-03-31 ENCOUNTER — Encounter: Payer: Self-pay | Admitting: Obstetrics and Gynecology

## 2018-03-31 VITALS — BP 102/62 | HR 43 | Ht 70.0 in | Wt 209.0 lb

## 2018-03-31 DIAGNOSIS — L732 Hidradenitis suppurativa: Secondary | ICD-10-CM

## 2018-03-31 DIAGNOSIS — K120 Recurrent oral aphthae: Secondary | ICD-10-CM

## 2018-03-31 NOTE — Patient Instructions (Signed)
Hidradenitis Suppurativa Hidradenitis suppurativa is a long-term (chronic) skin disease that starts with blocked sweat glands or hair follicles. Bacteria may grow in these blocked openings of your skin. Hidradenitis suppurativa is like a severe form of acne that develops in areas of your body where acne would be unusual. It is most likely to affect the areas of your body where skin rubs against skin and becomes moist. This includes your:  Underarms.  Groin.  Genital areas.  Buttocks.  Upper thighs.  Breasts.  Hidradenitis suppurativa may start out with small pimples. The pimples can develop into deep sores that break open (rupture) and drain pus. Over time your skin may thicken and become scarred. Hidradenitis suppurativa cannot be passed from person to person. What are the causes? The exact cause of hidradenitis suppurativa is not known. This condition may be due to:  Female and female hormones. The condition is rare before and after puberty.  An overactive body defense system (immune system). Your immune system may overreact to the blocked hair follicles or sweat glands and cause swelling and pus-filled sores.  What increases the risk? You may have a higher risk of hidradenitis suppurativa if you:  Are a woman.  Are between ages 11 and 55.  Have a family history of hidradenitis suppurativa.  Have a personal history of acne.  Are overweight.  Smoke.  Take the drug lithium.  What are the signs or symptoms? The first signs of an outbreak are usually painful skin bumps that look like pimples. As the condition progresses:  Skin bumps may get bigger and grow deeper into the skin.  Bumps under the skin may rupture and drain smelly pus.  Skin may become itchy and infected.  Skin may thicken and scar.  Drainage may continue through tunnels under the skin (fistulas).  Walking and moving your arms can become painful.  How is this diagnosed? Your health care provider may  diagnose hidradenitis suppurativa based on your medical history and your signs and symptoms. A physical exam will also be done. You may need to see a health care provider who specializes in skin diseases (dermatologist). You may also have tests done to confirm the diagnosis. These can include:  Swabbing a sample of pus or drainage from your skin so it can be sent to the lab and tested for infection.  Blood tests to check for infection.  How is this treated? The same treatment will not work for everybody with hidradenitis suppurativa. Your treatment will depend on how severe your symptoms are. You may need to try several treatments to find what works best for you. Part of your treatment may include cleaning and bandaging (dressing) your wounds. You may also have to take medicines, such as the following:  Antibiotics.  Acne medicines.  Medicines to block or suppress the immune system.  A diabetes medicine (metformin) is sometimes used to treat this condition.  For women, birth control pills can sometimes help relieve symptoms.  You may need surgery if you have a severe case of hidradenitis suppurativa that does not respond to medicine. Surgery may involve:  Using a laser to clear the skin and remove hair follicles.  Opening and draining deep sores.  Removing the areas of skin that are diseased and scarred.  Follow these instructions at home:  Learn as much as you can about your disease, and work closely with your health care providers.  Take medicines only as directed by your health care provider.  If you were prescribed   an antibiotic medicine, finish it all even if you start to feel better.  If you are overweight, losing weight may be very helpful. Try to reach and maintain a healthy weight.  Do not use any tobacco products, including cigarettes, chewing tobacco, or electronic cigarettes. If you need help quitting, ask your health care provider.  Do not shave the areas where you  get hidradenitis suppurativa.  Do not wear deodorant.  Wear loose-fitting clothes.  Try not to overheat and get sweaty.  Take a daily bleach bath as directed by your health care provider. ? Fill your bathtub halfway with water. ? Pour in  cup of unscented household bleach. ? Soak for 5-10 minutes.  Cover sore areas with a warm, clean washcloth (compress) for 5-10 minutes. Contact a health care provider if:  You have a flare-up of hidradenitis suppurativa.  You have chills or a fever.  You are having trouble controlling your symptoms at home. This information is not intended to replace advice given to you by your health care provider. Make sure you discuss any questions you have with your health care provider. Document Released: 01/27/2004 Document Revised: 11/20/2015 Document Reviewed: 09/14/2013 Elsevier Interactive Patient Education  2018 Elsevier Inc.  

## 2018-03-31 NOTE — Progress Notes (Signed)
Obstetrics & Gynecology Office Visit   Chief Complaint:  Chief Complaint  Patient presents with  . Follow-up    Had UTI in ER told to follow up, Has bump on inder arm close to arm pit    History of Present Illness: 42 year old who is status post TSvd and postpartum tubal ligation 3 months ago who did not present for 6 week postpartum visit.  She recently presented to ER with abdominal pain and was noted to have a UTI and well as boil in the right axilla.  She was started on bactrim DS and has since noted improvement in abdominal symptoms.  She still has the boil in the left axilla.  No fevers, no chills.  She reports a long standing history of axillary boils.  Is not breast feeding.  Menses regular monthly.  No abnormal vaginal discharge.     Review of Systems: Review of Systems  Constitutional: Negative for chills and fever.  HENT: Negative for congestion.   Respiratory: Negative for cough and shortness of breath.   Cardiovascular: Negative for chest pain and palpitations.  Gastrointestinal: Negative for abdominal pain, constipation, diarrhea, heartburn, nausea and vomiting.  Genitourinary: Negative for dysuria, frequency and urgency.  Skin: Negative for itching and rash.  Neurological: Negative for dizziness and headaches.  Endo/Heme/Allergies: Negative for polydipsia.  Psychiatric/Behavioral: Negative for depression.     Past Medical History:  Past Medical History:  Diagnosis Date  . Heart valve problem    Leaking 11/2016, causes random chest pain  . TMJ (dislocation of temporomandibular joint)     Past Surgical History:  Past Surgical History:  Procedure Laterality Date  . TONSILLECTOMY    . TUBAL LIGATION Bilateral 12/01/2017   Procedure: POST PARTUM TUBAL LIGATION;  Surgeon: Conard Novak, MD;  Location: [facility] ORS;  Service: Gynecology;  Laterality: Bilateral;    Gynecologic History: Patient's last menstrual period was 03/15/2018 (approximate).  Obstetric  History: [name]  Family History:  Family History  Problem Relation Age of Onset  . Hypertension Mother   . Heart disease Neg Hx   . Cancer Neg Hx   . Thyroid disease Neg Hx   . Diabetes Neg Hx     Social History:  Social History   Socioeconomic History  . Marital status: Married    Spouse name: Not on file  . Number of children: Not on file  . Years of education: Not on file  . Highest education level: Not on file  Occupational History  . Not on file  Social Needs  . Financial resource strain: Not on file  . Food insecurity:    Worry: Not on file    Inability: Not on file  . Transportation needs:    Medical: Not on file    Non-medical: Not on file  Tobacco Use  . Smoking status: Former Smoker    Packs/day: 2.00    Years: 31.00    Pack years: 62.00    Types: Cigarettes    Last attempt to quit: 08/02/2017    Years since quitting: 0.6  . Smokeless tobacco: Never Used  Substance and Sexual Activity  . Alcohol use: No    Frequency: Never  . Drug use: No  . Sexual activity: Yes    Birth control/protection: None  Lifestyle  . Physical activity:    Days per week: Not on file    Minutes per session: Not on file  . Stress: Not on file  Relationships  .  Social connections:    Talks on phone: Not on file    Gets together: Not on file    Attends religious service: Not on file    Active member of club or organization: Not on file    Attends meetings of clubs or organizations: Not on file    Relationship status: Not on file  . Intimate partner violence:    Fear of current or ex partner: Not on file    Emotionally abused: Not on file    Physically abused: Not on file    Forced sexual activity: Not on file  Other Topics Concern  . Not on file  Social History Narrative  . Not on file    Allergies:  No Known Allergies  Medications: Prior to Admission medications   Medication Sig Start Date End Date Taking? Authorizing Provider  sulfamethoxazole-trimethoprim  (BACTRIM DS,SEPTRA DS) 800-160 MG tablet  03/23/18  Yes [provider]    Physical Exam Vitals:  Vitals:   03/31/18 1100  BP: 102/62  Pulse: (!) 43   Patient's last menstrual period was 03/15/2018 (approximate).  General: NAD HEENT: normocephalic, anicteric Pulmonary: No increased work of breathing Abdomen: soft, non-tender, non-distended.  Umbilicus without lesions.  No hepatomegaly, splenomegaly or masses palpable. No evidence of hernia  Genitourinary:  External: Normal external female genitalia.  Normal urethral meatus, normal  Bartholin's and Skene's glands.    Vagina: Normal vaginal mucosa, no evidence of prolapse.    Cervix: Grossly normal in appearance, no bleeding  Uterus: Non-enlarged, mobile, normal contour.  No CMT  Adnexa: ovaries non-enlarged, no adnexal masses  Rectal: deferred  Lymphatic: no evidence of inguinal lymphadenopathy Extremities: no edema, erythema, or tenderness Neurologic: Grossly intact Skin: right axilla with small carunlea Psychiatric: mood appropriate, affect full  Female chaperone present for pelvic  portions of the physical exam  Assessment: 42 y.o. [id number] presenting for ER follow up of UTI, liklly hydradenitis   Plan: Problem List Items Addressed This Visit    None    Visit Diagnoses    Hydradenitis    -  Primary   Aphthae, oral         1) Hydradenitis - mild, no residual scarring or sinus tracts.  Discussed remitting course and no definitive curative or preventative treatment  2) UTI - symptoms resolved  3) Pap smear- up to date 06/2017  4) Mammogram - recommend obtaining 6 week postpartum  5) Apthous ulcer - expectant mangement  6) A total of 15 minutes were spent in face-to-face contact with the patient during this encounter with over half of that time devoted to counseling and coordination of care.  7) Return in about 1 year (around 04/01/2019) for annual.   Vena Austria, MD, [facility], [facility] 03/31/2018, 9:41 PM

## 2018-08-13 HISTORY — PX: OTHER SURGICAL HISTORY: SHX169

## 2019-01-22 ENCOUNTER — Ambulatory Visit: Payer: Medicaid Other | Admitting: Neurology

## 2019-04-04 HISTORY — PX: COLONOSCOPY: SHX174

## 2019-06-06 ENCOUNTER — Ambulatory Visit (INDEPENDENT_AMBULATORY_CARE_PROVIDER_SITE_OTHER): Payer: Medicaid Other | Admitting: Nurse Practitioner

## 2019-06-06 ENCOUNTER — Encounter (INDEPENDENT_AMBULATORY_CARE_PROVIDER_SITE_OTHER): Payer: Self-pay | Admitting: Nurse Practitioner

## 2019-06-06 ENCOUNTER — Other Ambulatory Visit: Payer: Self-pay

## 2019-06-06 VITALS — BP 102/68 | HR 81 | Temp 97.7°F | Ht 70.0 in | Wt 249.1 lb

## 2019-06-06 DIAGNOSIS — K5190 Ulcerative colitis, unspecified, without complications: Secondary | ICD-10-CM | POA: Insufficient documentation

## 2019-06-06 DIAGNOSIS — K51311 Ulcerative (chronic) rectosigmoiditis with rectal bleeding: Secondary | ICD-10-CM

## 2019-06-06 MED ORDER — MESALAMINE 1.2 G PO TBEC: 2.4000 g | DELAYED_RELEASE_TABLET | Freq: Two times a day (BID) | ORAL | 2 refills | Status: DC

## 2019-06-06 NOTE — Patient Instructions (Signed)
1.  Complete the provided lab order  2.  Mesalamine 1.2 g 2 tabs by mouth twice daily  3.  Avoid Aleve/Advil/aspirin/Goody powder recommended  4.  Follow-up in the office in 6 weeks

## 2019-06-06 NOTE — Progress Notes (Addendum)
Subjective:    Patient ID: Karina Franco, female    DOB: 01/13/1976, 43 y.o.   MRN: [id number]  HPI Karina Franco is a 43 year old female with a past medical history of hypertension, SVT s/p ablation,  migraine headaches, mild MR with chronic chest pain followed by cardiology and GERD. Past tubal ligation and tonsillectomy. She developed rectal bleeding and she was evaluated by surgeon, Dr. Adelina Mings. She underwent a colonoscopy 04/04/2019 which identified mild to moderate colitis extending from the rectum to the left colon. Biopsies showed active chronic colitis without dysplasia. Biopsies of a rectal lesion near the anal canal showed chronic colitis. No dysplasia. She presents today for further GI evaluation. She is passing 2 to 4 soft stools daily. She continues to see a small to moderate amount of bright red blood on the toilet tissue, on the stool and in the toilet water. Sometimes the blood is a little darker. No melena. No mucous per the rectum. No tenesmus. No upper or lower abdominal pain. No family history of IBD or colorectal cancer. She has heartburn a few days weekly after dinner. However, she had daily heartburn which significantly improved over the past few months since she started taking Famotidine 40mg  one tab after dinner. She takes Aleve 220mg  two tabs in the evenings for headaches every third night. She previously took Aleve 2 tabs once daily x 20 years. She stated Aleve is the only medication that reduces her headache pain. Infrequent alcohol intake, less than 2 drinks monthly. No drug use. Past smoker. She has minimal MR. She has chronic chest pain which has improved since starting Metoprolol. She denies having coronary artery disease. She is scheduled to see her cardiologist, Dr. Olevia Bowens, on 06/12/2019. No recent labs.   ECHO 12/02/2017:  - Left ventricle: Systolic function was normal. The estimated   ejection fraction was in the range of 55% to 60%. Wall motion was  normal; there were no regional wall motion abnormalities. Left   ventricular diastolic function parameters were normal. - Mitral valve: There was mild regurgitation. - Left atrium: The atrium was mildly dilated. - Right ventricle: Systolic function was normal.  Past Medical History:  Diagnosis Date  . Heart valve problem    Leaking 11/2016, causes random chest pain  . TMJ (dislocation of temporomandibular joint)    Past Surgical History:  Procedure Laterality Date  . Ablation - heart  08/13/2018  . COLONOSCOPY  04/04/2019   [facility]  . TONSILLECTOMY    . TUBAL LIGATION Bilateral 12/01/2017   Procedure: POST PARTUM TUBAL LIGATION;  Surgeon: Will Bonnet, MD;  Location: [facility] ORS;  Service: Gynecology;  Laterality: Bilateral;   Current Outpatient Medications on File Prior to Visit  Medication Sig Dispense Refill  . atorvastatin (LIPITOR) 80 MG tablet Take 80 mg by mouth daily.     . famotidine (PEPCID) 40 MG tablet Take by mouth daily.     . metoprolol succinate (TOPROL-XL) 50 MG 24 hr tablet Take 50 mg by mouth daily.     . Naproxen Sodium (ALEVE PO) Take by mouth. Patient states that she takes as needed for migraines.     No current facility-administered medications on file prior to visit.    No Known Allergies  Family History  Problem Relation Age of Onset  . Hypertension Mother   . Heart disease Neg Hx   . Cancer Neg Hx   . Thyroid disease Neg Hx   . Diabetes Neg Hx  Social History   Socioeconomic History  . Marital status: Married    Spouse name: Not on file  . Number of children: Not on file  . Years of education: Not on file  . Highest education level: Not on file  Occupational History  . Not on file  Social Needs  . Financial resource strain: Not on file  . Food insecurity    Worry: Not on file    Inability: Not on file  . Transportation needs    Medical: Not on file    Non-medical: Not on file  Tobacco Use  . Smoking status: Former  Smoker    Packs/day: 2.00    Years: 31.00    Pack years: 62.00    Types: Cigarettes    Quit date: 08/02/2017    Years since quitting: 1.8  . Smokeless tobacco: Never Used  Substance and Sexual Activity  . Alcohol use: No    Frequency: Never  . Drug use: No  . Sexual activity: Yes    Birth control/protection: None  Lifestyle  . Physical activity    Days per week: Not on file    Minutes per session: Not on file  . Stress: Not on file  Relationships  . Social Musician on phone: Not on file    Gets together: Not on file    Attends religious service: Not on file    Active member of club or organization: Not on file    Attends meetings of clubs or organizations: Not on file    Relationship status: Not on file  . Intimate partner violence    Fear of current or ex partner: Not on file    Emotionally abused: Not on file    Physically abused: Not on file    Forced sexual activity: Not on file  Other Topics Concern  . Not on file  Social History Narrative  . Not on file   Review of Systems  Gen: Denies fever, sweats or chills. No weight loss.  CV: See HPI. Resp: Denies cough, shortness of breath of hemoptysis.  GI: See HPI. GU : Denies urinary burning, blood in urine, increased urinary frequency or incontinence. MS: Denies joint pain, muscles aches or weakness. Derm: Denies rash, itchiness, skin lesions or unhealing ulcers. Psych: Denies depression, anxiety or memory loss. Heme: Denies bruising, bleeding. Neuro:  Denies headaches, dizziness or paresthesias. Endo:  Denies any problems with DM, thyroid or adrenal function.    Objective:   Physical Exam BP 102/68 (BP Location: Right Arm, Patient Position: Sitting, Cuff Size: Large)   Pulse 81   Temp 97.7 F (36.5 C) (Oral)   Ht 5\' 10"  (1.778 m)   Wt 249 lb 1.6 oz (113 kg)   BMI 35.74 kg/m  General: 43 year old female in no acute distress Eyes: Sclera nonicteric, conjunctiva pink Mouth: No ulcers or lesions  Neck: Supple Heart: Regular rate and rhythm, no murmurs Lungs: Breath sounds clear throughout Abdomen: Obese, soft, nontender, positive bowel sounds to all 4 quadrants, no HSM Rectal: Small left external hemorrhoids, right hypertrophied papilla and a posterior fissure without active bleeding    Assessment & Plan:   1.  Ulcerative colitis, left colon and rectum as verified by Dr. 55 -CBC, CMP and CRP -Fecal Calprotectetin  -Mesalamine 1.2 g 2 tabs p.o. twice daily.  Patient is concerned she will forget to take the medicine twice a day therefore she elects to take Mesalamine 1.2 g four tabs po once daily. -  Follow-up in the office in 6 weeks -Dr. Karilyn Cotaehman consulted, colonoscopy procedure report and biopsy results reviewed -Avoid NSAIDs  2.  GERD -Continue Famotidine 40 mg once daily -Consider future EGD, to discuss further at time of next follow up appointment   3. Chronic chest pain, minimal MR

## 2019-06-12 IMAGING — DX DG CHEST 1V
1 series · 1 of 1 positions shown · non-contrast
Comparison: None.

CLINICAL DATA: Chest pain

EXAM:
CHEST  1 VIEW

[chest ap]
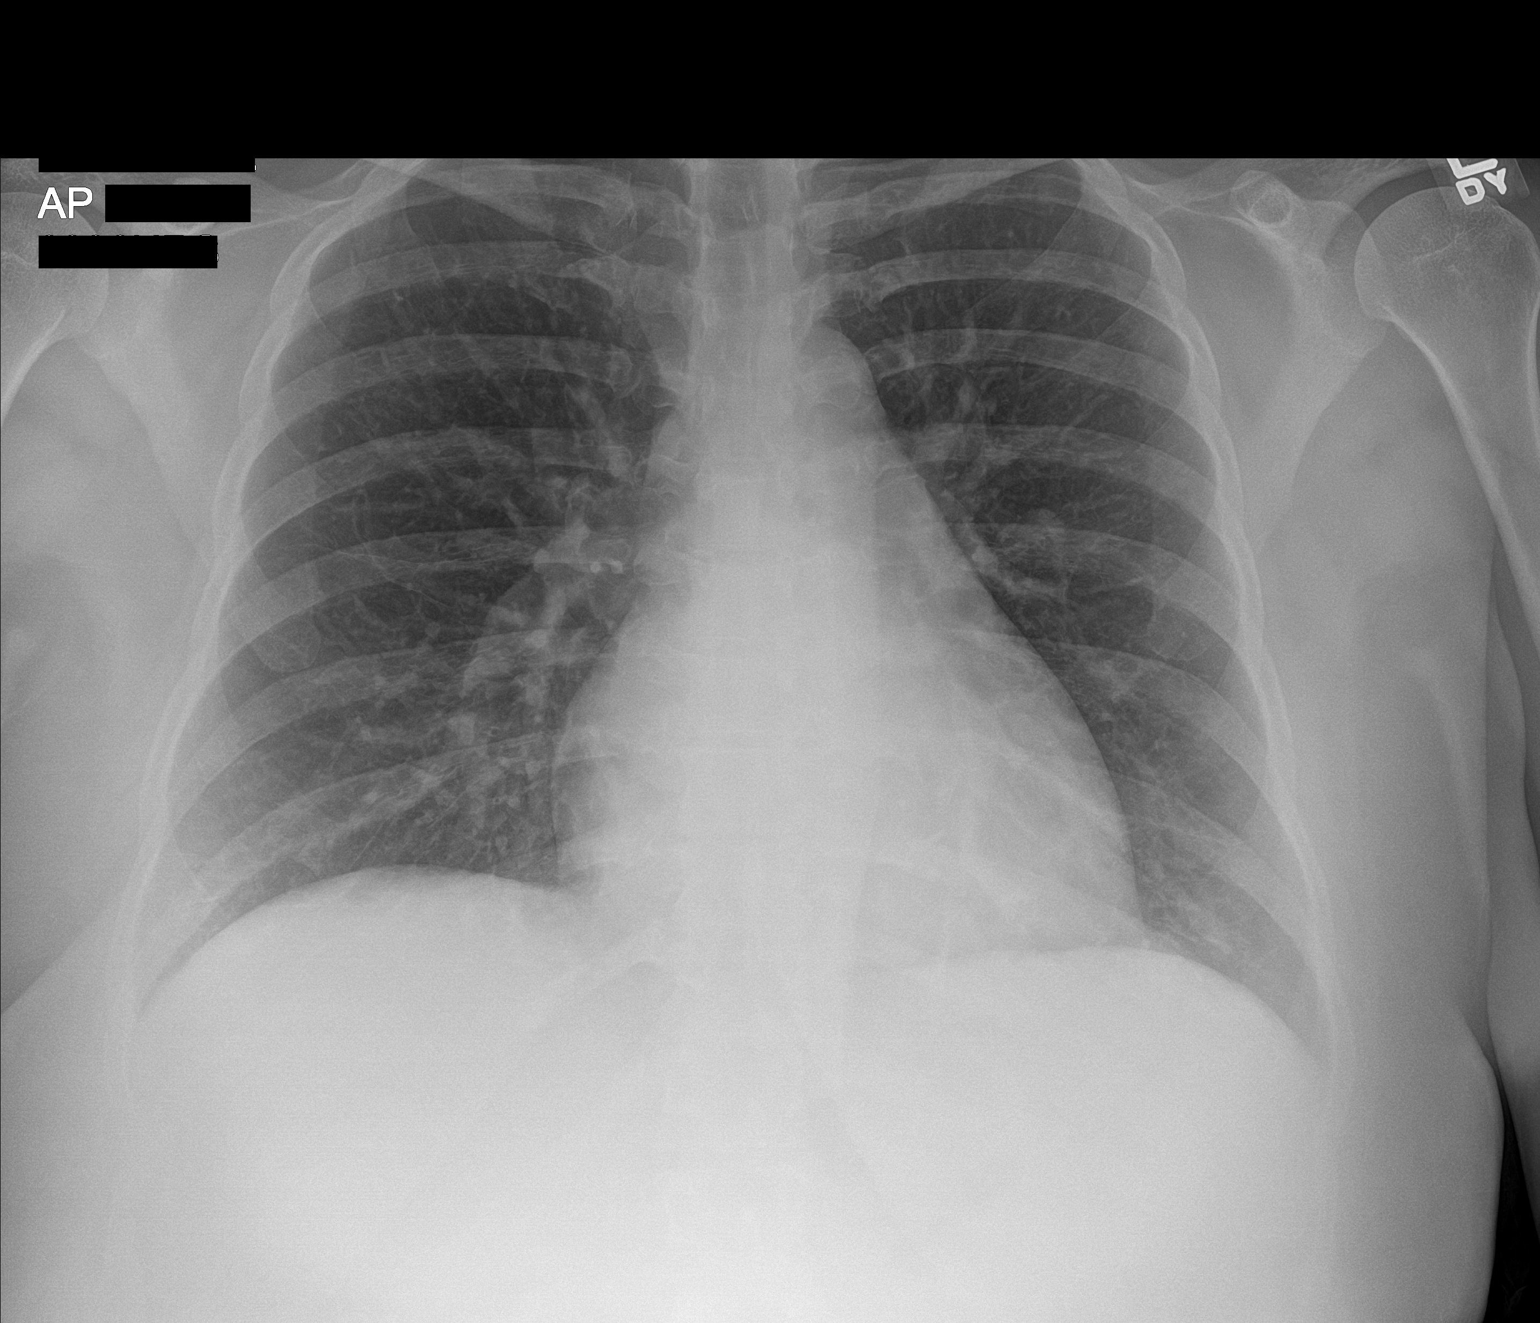

[1 of 1 positions shown; findings below may reference images not displayed]

FINDINGS: Lungs are clear. Heart is upper normal in size with pulmonary
vascularity within normal limits. No adenopathy. No evident bone
lesions. No pneumothorax.
IMPRESSION: No edema or consolidation.

## 2019-07-13 LAB — COMPLETE METABOLIC PANEL WITH GFR
AG Ratio: 1.6 (calc) (ref 1.0–2.5)
ALT: 22 U/L (ref 6–29)
AST: 20 U/L (ref 10–30)
Albumin: 4.1 g/dL (ref 3.6–5.1)
Alkaline phosphatase (APISO): 128 U/L — ABNORMAL HIGH (ref 31–125)
BUN: 10 mg/dL (ref 7–25)
CO2: 27 mmol/L (ref 20–32)
Calcium: 8.7 mg/dL (ref 8.6–10.2)
Chloride: 105 mmol/L (ref 98–110)
Creat: 0.66 mg/dL (ref 0.50–1.10)
GFR, Est African American: 125 mL/min/{1.73_m2} (ref >=60)
GFR, Est Non African American: 108 mL/min/{1.73_m2} (ref >=60)
Globulin: 2.6 g/dL (calc) (ref 1.9–3.7)
Glucose, Bld: 87 mg/dL (ref 65–139)
Potassium: 3.7 mmol/L (ref 3.5–5.3)
Sodium: 140 mmol/L (ref 135–146)
Total Bilirubin: 0.3 mg/dL (ref 0.2–1.2)
Total Protein: 6.7 g/dL (ref 6.1–8.1)

## 2019-07-13 LAB — CBC WITH DIFFERENTIAL/PLATELET
Absolute Monocytes: 484 cells/uL (ref 200–950)
Basophils Absolute: 39 cells/uL (ref 0–200)
Basophils Relative: 0.5 %
Eosinophils Absolute: 172 cells/uL (ref 15–500)
Eosinophils Relative: 2.2 %
HCT: 32.3 % — ABNORMAL LOW (ref 35.0–45.0)
Hemoglobin: 10.7 g/dL — ABNORMAL LOW (ref 11.7–15.5)
Lymphs Abs: 1552 cells/uL (ref 850–3900)
MCH: 29.1 pg (ref 27.0–33.0)
MCHC: 33.1 g/dL (ref 32.0–36.0)
MCV: 87.8 fL (ref 80.0–100.0)
MPV: 11.3 fL (ref 7.5–12.5)
Monocytes Relative: 6.2 %
Neutro Abs: 5554 cells/uL (ref 1500–7800)
Neutrophils Relative %: 71.2 %
Platelets: 248 10*3/uL (ref 140–400)
RBC: 3.68 10*6/uL — ABNORMAL LOW (ref 3.80–5.10)
RDW: 12.9 % (ref 11.0–15.0)
Total Lymphocyte: 19.9 %
WBC: 7.8 10*3/uL (ref 3.8–10.8)

## 2019-07-13 LAB — C-REACTIVE PROTEIN: CRP: 5.7 mg/L (ref <8.0)

## 2019-07-19 ENCOUNTER — Ambulatory Visit (INDEPENDENT_AMBULATORY_CARE_PROVIDER_SITE_OTHER): Payer: Medicaid Other | Admitting: Gastroenterology

## 2019-07-19 ENCOUNTER — Encounter (INDEPENDENT_AMBULATORY_CARE_PROVIDER_SITE_OTHER): Payer: Self-pay | Admitting: Gastroenterology

## 2019-07-19 ENCOUNTER — Other Ambulatory Visit: Payer: Self-pay

## 2019-07-19 VITALS — BP 118/66 | HR 69 | Temp 97.3°F | Ht 70.0 in | Wt 253.6 lb

## 2019-07-19 DIAGNOSIS — D649 Anemia, unspecified: Secondary | ICD-10-CM | POA: Diagnosis not present

## 2019-07-19 DIAGNOSIS — R748 Abnormal levels of other serum enzymes: Secondary | ICD-10-CM | POA: Diagnosis not present

## 2019-07-19 MED ORDER — MESALAMINE 1.2 G PO TBEC: 2.4000 g | DELAYED_RELEASE_TABLET | Freq: Every day | ORAL | 6 refills | Status: DC

## 2019-07-19 NOTE — Patient Instructions (Signed)
We are checking your labs today for evaluation, hemoglobin was low and need to check iron levels.  You also have a liver enzyme elevated that we are following up on.  Continue Lialda, we are decreasing from 4 pills to 2 pills a day.  If rectal bleeding returns important to contact me.  Follow-up 6 months-please call sooner if needed

## 2019-07-19 NOTE — Progress Notes (Signed)
Patient profile: Karina Franco is a 44 y.o. female seen for evaluation of ulcerative colitis. Last seen in clinic on 05/2019.   History of Present Illness: Karina Franco is seen today for follow-up of ulcerative colitis diagnosed October 2020 (colonoscopy completed for rectal bleeding).  She was seen in December 2020 and started on Lialda 4.8 g a day, she reports this significantly decreased her rectal bleeding.  She is having a very scant amount on the toilet tissue with wiping at this time.  Previously was a larger volume of bleeding mixed in the stool.  Her bowel habits are still 3 bowel movements a day that can be loose.  Reports this is her chronic bowel habits and frequency did not change with the Lialda but bleeding decreased.  She does not have any abdominal cramping or rectal pain.  Overall she feels she is doing well.  She takes famotidine in the evening which has helped her GERD symptoms. She is trying to decrease greasy foods to lose weight.  She does drink 2-3 sodas a day.  She stopped smoking 2 years ago, stopped drinking alcohol 3 years ago.  Does report she was previously drinking alcohol daily.  She was taking Aleve 10-12 times a month prior to her visit in December but has transition to Tylenol for headaches since  Wt Readings from Last 3 Encounters:  07/19/19 253 lb 9.6 oz (115 kg)  06/06/19 249 lb 1.6 oz (113 kg)  03/31/18 209 lb (94.8 kg)     Last Colonoscopy: Report from [facility] to be scanned to chart 03/2019--mild to moderate colitis extending rectum and left colon, cecum ascending transverse were normal.  No polyps or diverticulosis.  Pathology with normally active chronic colitis on rectal biopsy.  Chronic colitis on distal rectal biopsy.    Past Medical History:  Past Medical History:  Diagnosis Date  . Heart valve problem    Leaking 11/2016, causes random chest pain  . TMJ (dislocation of temporomandibular joint)     Problem List: Patient Active  Problem List   Diagnosis Date Noted  . Ulcerative colitis ([place]) 06/06/2019  . Encounter for sterilization 12/01/2017  . Indication for care in labor and delivery, antepartum 11/29/2017  . [redacted] weeks gestation of pregnancy 11/19/2017  . Candida infection 09/29/2017  . Gastroesophageal reflux disease without esophagitis 09/12/2017  . Encounter for dental exam and cleaning w/o abnormal findings 07/14/2017  . Tobacco smoking affecting pregnancy, antepartum 06/30/2017  . Antepartum multigravida of advanced maternal age 62/08/2017  . Supervision of high risk pregnancy, antepartum 06/30/2017    Past Surgical History: Past Surgical History:  Procedure Laterality Date  . Ablation - heart  08/13/2018  . COLONOSCOPY  04/04/2019   [facility]  . TONSILLECTOMY    . TUBAL LIGATION Bilateral 12/01/2017   Procedure: POST PARTUM TUBAL LIGATION;  Surgeon: Will Bonnet, MD;  Location: [facility] ORS;  Service: Gynecology;  Laterality: Bilateral;    Allergies: No Known Allergies    Home Medications:  Current Outpatient Medications:  .  acetaminophen (TYLENOL) 500 MG tablet, Take 500 mg by mouth as needed., Disp: , Rfl:  .  atorvastatin (LIPITOR) 80 MG tablet, Take 80 mg by mouth daily. , Disp: , Rfl:  .  famotidine (PEPCID) 40 MG tablet, Take by mouth daily. , Disp: , Rfl:  .  metoprolol succinate (TOPROL-XL) 50 MG 24 hr tablet, Take 50 mg by mouth daily. , Disp: , Rfl:  .  mesalamine (LIALDA) 1.2 g  EC tablet, Take 2 tablets (2.4 g total) by mouth daily., Disp: 60 tablet, Rfl: 6 .  Naproxen Sodium (ALEVE PO), Take by mouth. Patient states that she takes as needed for migraines., Disp: , Rfl:    Family History: family history includes Hypertension in her mother.    Social History:   reports that she quit smoking about 1 years ago. Her smoking use included cigarettes. She has a 62.00 pack-year smoking history. She has never used smokeless tobacco. She reports that she does not drink  alcohol or use drugs.   Review of Systems: Constitutional: Denies weight loss/weight gain  Eyes: No changes in vision. ENT: No oral lesions, sore throat.  GI: see HPI.  Heme/Lymph: No easy bruising.  CV: No chest pain.  GU: No hematuria.  Integumentary: No rashes.  Neuro: No headaches.  Psych: No depression/anxiety.  Endocrine: No heat/cold intolerance.  Allergic/Immunologic: No urticaria.  Resp: No cough, SOB.  Musculoskeletal: No joint swelling.    Physical Examination: BP 118/66 (BP Location: Right Arm, Patient Position: Sitting, Cuff Size: Large)   Pulse 69   Temp (!) 97.3 F (36.3 C) (Temporal)   Ht '5\' 10"'  (1.778 m)   Wt 253 lb 9.6 oz (115 kg)   BMI 36.39 kg/m  Gen: NAD, alert and oriented x 4 HEENT: PEERLA, EOMI, Neck: supple, no JVD Chest: CTA bilaterally, no wheezes, crackles, or other adventitious sounds CV: RRR, no m/g/c/r Abd: soft, NT, ND, +BS in all four quadrants; no HSM, guarding, ridigity, or rebound tenderness Ext: no edema, well perfused with 2+ pulses, Skin: no rash or lesions noted on observed skin Lymph: no noted LAD  Data Reviewed:  06/2018-CRP normal, CMP w/ alk phos 128, CBC w/ Hgb 10.7, MCV 87  Assessment/Plan: [name] is a 44 y.o. female    [name] was seen today for follow-up.  Diagnoses and all orders for this visit:  Elevated alkaline phosphatase level -     Alkaline phosphatase, isoenzymes -     Cancel: Hepatic function panel -     Gamma GT -     Mitochondrial/smooth muscle ab pnl -     ANA -     CBC with Differential -     Fe+TIBC+Fer -     Hepatic function panel  Anemia, unspecified type -     Alkaline phosphatase, isoenzymes -     Cancel: Hepatic function panel -     Gamma GT -     Mitochondrial/smooth muscle ab pnl -     ANA -     CBC with Differential -     Fe+TIBC+Fer  Other orders -     mesalamine (LIALDA) 1.2 g EC tablet; Take 2 tablets (2.4 g total) by mouth daily.   1.  Anemia-may be combination of  rectal bleeding and she reports heavy menstrual cycles.  We will check iron studies today to ensure she does not need iron supplement.  Rectal bleeding has decreased  2.  Ulcerative colitis-doing well on Lialda 4.8 g/day she wants to stop medicine, we discussed decreasing to 2.4 g/day long-term as alternative to continue mesalamine given colonoscopy findings.  She is agreeable to this.  She will contact me if develops worsening symptoms with dose decrease  3.  Alk phos minimally elevated on labs prior to appointment-we will recheck with labs as above to determine if hepatic source and check other labs given higher risk for PSC due to UC.  We will call patient with lab results.  She will be seen in office in 6 months for f/up-sooner if needed   I personally performed the service, non-incident to. (WP)  Laurine Blazer, [facility]

## 2019-11-19 ENCOUNTER — Ambulatory Visit: Payer: Medicaid Other | Attending: Family Medicine | Admitting: Audiologist

## 2019-11-19 ENCOUNTER — Other Ambulatory Visit: Payer: Self-pay

## 2019-11-19 DIAGNOSIS — H9313 Tinnitus, bilateral: Secondary | ICD-10-CM | POA: Diagnosis present

## 2019-11-19 DIAGNOSIS — H9193 Unspecified hearing loss, bilateral: Secondary | ICD-10-CM | POA: Diagnosis present

## 2019-11-19 NOTE — Procedures (Signed)
Outpatient Audiology and [facility] [address] [place], [place]  [address] [phone]  AUDIOLOGICAL  EVALUATION  NAME: Karina Franco     DOB:   11-20-1975      MRN: [id number]                                                                                     DATE: 11/19/2019     REFERENT: Suzan Slick, MD STATUS: Outpatient DIAGNOSIS: Tinnitus Bilateral, Hearing Loss Bilateral    History: Karina Franco was seen for an audiological evaluation due to worsening tinnitus in both ears.  Karina Franco has difficulty hearing people and has to ask them to repeat. Pain and intermittent pressure reported in both ears. Tinnitus present in both ears and is her main concern. Tinnitus started 12 years ago. She went to an ENT and was told it is likely TMJ. She went to a dentist and was told tinnitus does not result from TMJ. She reports waking up with jaw pain. In the last few months tinnitus is getting worse. No significant new stressors or change in medication as time of worsening. She has three young children and is constantly exposed to high noise levels. She says her children will sometimes scream in her ear. Karina Franco also has migraines on a daily basis. She also consumes a significant amount of soda with caffeine. The tinnitus does not spike in reaction to the migraines or with caffeine.   Evaluation:   Otoscopy showed a clear view of the tympanic membranes, bilaterally  Tympanometry results were consistent with normal function of the middle ear   Audiometric testing was completed using conventional audiometry with headphones and cross checked with insert transducer. Speech Detection Thresholds were  consistent with pure tone averages. Word Recognition was excellent at conversation. Pure tone thresholds show a mild hearing loss at 6,000 and 8,000 Hz in both ears. 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 Karina Franco has main triggered by pressure on temples,  and low tolerance for anything in the ear. Bone reliability is fair.   Tinnitus pitch and loudness matched in 1dB steps to 29dB bilaterally at 1,500 Hz. Tinnitus is about 9dB SL.   Results:  The test results were reviewed with [place]. She was advised of the soft volume of the tinnitus when matched to an exterior equivalent. This shows with stress reduction, and periods of rest from loud noise and stress the tinnitus will likely return to a manageable level.   Recommendations: 1. Fleet Contras needs periods of quiet (10-20 minutes a day) with a steady state neutral masking tone, away from family and noise. No TV noise or music recommended as this keeps the auditory system engaged. These periods of quiet help shift the nervous system out of the fight or flight vigilance that can exasperate tinnitus.  1. The MyNoise App is a helpful app for creating a neutral steady state masking sound.  2. If pain continues to worsen recommend consulting with PCP for pain management strategies.  3. Referral to a dentist familiar with treatment of TMJ is recommended to decrease tension in the jaw at night and subsequent pain and increase in tinnitus.  4.  Reduction of caffeine consumption is recommended.  5. Should Karina Franco continue to have increasing tinnitus and the above recommendations are not sufficient, referral to the [facility]'s Dr. Argentina Ponder is necessary. Dr. Argentina Ponder provides in depth tinnitus retraining therapy appointments.    Karina Franco  Audiologist, Au.D., CCC-A 11/19/2019  5:15 PM  Cc: Leeanne Rio, MD

## 2019-11-21 ENCOUNTER — Ambulatory Visit: Payer: Medicaid Other | Admitting: Audiologist

## 2020-01-16 ENCOUNTER — Ambulatory Visit (INDEPENDENT_AMBULATORY_CARE_PROVIDER_SITE_OTHER): Payer: Medicaid Other | Admitting: Gastroenterology

## 2020-01-21 ENCOUNTER — Telehealth (INDEPENDENT_AMBULATORY_CARE_PROVIDER_SITE_OTHER): Payer: Self-pay | Admitting: Gastroenterology

## 2020-01-21 MED ORDER — MESALAMINE 1.2 G PO TBEC: 2.4000 g | DELAYED_RELEASE_TABLET | Freq: Every day | ORAL | 6 refills | Status: AC

## 2020-01-21 NOTE — Telephone Encounter (Signed)
Medication refill sent to pharmacy for Lialda 2.4 g a day.  I would like to see her back in the office in a few weeks to see how she is doing, please arrange appointment.  Thanks.

## 2020-01-21 NOTE — Telephone Encounter (Signed)
Patient called stated she has ran out of her mesalamine - was doing good until she ran out - please advise - [phone]

## 2020-03-17 ENCOUNTER — Ambulatory Visit (INDEPENDENT_AMBULATORY_CARE_PROVIDER_SITE_OTHER): Payer: Medicaid Other | Admitting: Gastroenterology
